# Patient Record
Sex: Female | Born: 1950
Health system: Southern US, Community
[De-identification: ages and names within clinical notes are randomized; demographics above are authoritative.]

## PROBLEM LIST (undated history)

## (undated) DIAGNOSIS — Z803 Family history of malignant neoplasm of breast: Secondary | ICD-10-CM

## (undated) DIAGNOSIS — E559 Vitamin D deficiency, unspecified: Secondary | ICD-10-CM

## (undated) DIAGNOSIS — G2581 Restless legs syndrome: Secondary | ICD-10-CM

## (undated) DIAGNOSIS — T753XXA Motion sickness, initial encounter: Secondary | ICD-10-CM

## (undated) DIAGNOSIS — E7800 Pure hypercholesterolemia, unspecified: Secondary | ICD-10-CM

## (undated) DIAGNOSIS — I10 Essential (primary) hypertension: Secondary | ICD-10-CM

## (undated) DIAGNOSIS — M810 Age-related osteoporosis without current pathological fracture: Secondary | ICD-10-CM

## (undated) DIAGNOSIS — Z801 Family history of malignant neoplasm of trachea, bronchus and lung: Secondary | ICD-10-CM

## (undated) DIAGNOSIS — Z87442 Personal history of urinary calculi: Secondary | ICD-10-CM

## (undated) DIAGNOSIS — K219 Gastro-esophageal reflux disease without esophagitis: Secondary | ICD-10-CM

## (undated) HISTORY — DX: Family history of malignant neoplasm of trachea, bronchus and lung: Z80.1

## (undated) HISTORY — PX: COLONOSCOPY: SHX174

## (undated) HISTORY — PX: ABDOMINAL HYSTERECTOMY: SHX81

## (undated) HISTORY — PX: BREAST CYST ASPIRATION: SHX578

## (undated) HISTORY — DX: Family history of malignant neoplasm of breast: Z80.3

---

## 1998-01-12 HISTORY — PX: FOOT SURGERY: SHX648

## 2002-12-11 ENCOUNTER — Other Ambulatory Visit: Payer: Self-pay

## 2004-07-24 ENCOUNTER — Ambulatory Visit: Payer: Self-pay | Admitting: Internal Medicine

## 2005-08-18 ENCOUNTER — Ambulatory Visit: Payer: Self-pay | Admitting: Internal Medicine

## 2005-08-18 ENCOUNTER — Ambulatory Visit: Payer: Self-pay

## 2006-06-30 ENCOUNTER — Ambulatory Visit: Payer: Self-pay | Admitting: Internal Medicine

## 2006-08-23 ENCOUNTER — Ambulatory Visit: Payer: Self-pay | Admitting: Internal Medicine

## 2007-08-01 ENCOUNTER — Ambulatory Visit: Payer: Self-pay | Admitting: Internal Medicine

## 2007-08-02 ENCOUNTER — Ambulatory Visit: Payer: Self-pay

## 2007-08-08 ENCOUNTER — Ambulatory Visit: Payer: Self-pay | Admitting: Internal Medicine

## 2007-08-10 ENCOUNTER — Ambulatory Visit: Payer: Self-pay | Admitting: Internal Medicine

## 2007-09-15 ENCOUNTER — Ambulatory Visit: Payer: Self-pay | Admitting: Internal Medicine

## 2007-09-27 ENCOUNTER — Ambulatory Visit: Payer: Self-pay | Admitting: Unknown Physician Specialty

## 2008-10-18 ENCOUNTER — Ambulatory Visit: Payer: Self-pay | Admitting: Family Medicine

## 2008-10-30 ENCOUNTER — Ambulatory Visit: Payer: Self-pay | Admitting: Family Medicine

## 2009-03-21 ENCOUNTER — Ambulatory Visit: Payer: Self-pay | Admitting: Urology

## 2009-08-28 ENCOUNTER — Ambulatory Visit: Payer: Self-pay | Admitting: Anesthesiology

## 2009-09-02 ENCOUNTER — Ambulatory Visit: Payer: Self-pay | Admitting: Urology

## 2009-11-20 ENCOUNTER — Ambulatory Visit: Payer: Self-pay | Admitting: Family Medicine

## 2009-11-21 ENCOUNTER — Ambulatory Visit: Payer: Self-pay | Admitting: Family Medicine

## 2009-12-04 ENCOUNTER — Ambulatory Visit: Payer: Self-pay | Admitting: Family Medicine

## 2010-04-25 ENCOUNTER — Ambulatory Visit: Payer: Self-pay | Admitting: Family Medicine

## 2010-05-14 ENCOUNTER — Ambulatory Visit: Payer: Self-pay | Admitting: Family Medicine

## 2011-02-11 ENCOUNTER — Ambulatory Visit: Payer: Self-pay | Admitting: Family Medicine

## 2011-06-24 ENCOUNTER — Ambulatory Visit: Payer: Self-pay | Admitting: Family Medicine

## 2012-02-16 ENCOUNTER — Ambulatory Visit: Payer: Self-pay | Admitting: Family Medicine

## 2013-02-16 ENCOUNTER — Ambulatory Visit: Payer: Self-pay | Admitting: Family Medicine

## 2014-04-05 ENCOUNTER — Ambulatory Visit: Payer: Self-pay | Admitting: Internal Medicine

## 2014-04-06 ENCOUNTER — Ambulatory Visit: Payer: Self-pay | Admitting: Internal Medicine

## 2015-04-01 ENCOUNTER — Other Ambulatory Visit: Payer: Self-pay | Admitting: Internal Medicine

## 2015-04-01 DIAGNOSIS — Z0000 Encounter for general adult medical examination without abnormal findings: Secondary | ICD-10-CM | POA: Diagnosis not present

## 2015-04-01 DIAGNOSIS — F5104 Psychophysiologic insomnia: Secondary | ICD-10-CM | POA: Diagnosis not present

## 2015-04-01 DIAGNOSIS — F17220 Nicotine dependence, chewing tobacco, uncomplicated: Secondary | ICD-10-CM | POA: Diagnosis not present

## 2015-04-01 DIAGNOSIS — E559 Vitamin D deficiency, unspecified: Secondary | ICD-10-CM | POA: Diagnosis not present

## 2015-04-01 DIAGNOSIS — Z1231 Encounter for screening mammogram for malignant neoplasm of breast: Secondary | ICD-10-CM

## 2015-04-01 DIAGNOSIS — Z1211 Encounter for screening for malignant neoplasm of colon: Secondary | ICD-10-CM | POA: Diagnosis not present

## 2015-04-01 DIAGNOSIS — E7800 Pure hypercholesterolemia, unspecified: Secondary | ICD-10-CM | POA: Diagnosis not present

## 2015-04-01 DIAGNOSIS — M8580 Other specified disorders of bone density and structure, unspecified site: Secondary | ICD-10-CM | POA: Diagnosis not present

## 2015-04-01 DIAGNOSIS — Z79899 Other long term (current) drug therapy: Secondary | ICD-10-CM | POA: Diagnosis not present

## 2015-04-01 DIAGNOSIS — I10 Essential (primary) hypertension: Secondary | ICD-10-CM | POA: Diagnosis not present

## 2015-04-01 DIAGNOSIS — Z1239 Encounter for other screening for malignant neoplasm of breast: Secondary | ICD-10-CM | POA: Diagnosis not present

## 2015-04-01 DIAGNOSIS — Z720 Tobacco use: Secondary | ICD-10-CM | POA: Diagnosis not present

## 2015-04-04 DIAGNOSIS — Z1211 Encounter for screening for malignant neoplasm of colon: Secondary | ICD-10-CM | POA: Diagnosis not present

## 2015-04-08 ENCOUNTER — Other Ambulatory Visit: Payer: Self-pay | Admitting: Internal Medicine

## 2015-04-08 ENCOUNTER — Ambulatory Visit
Admission: RE | Admit: 2015-04-08 | Discharge: 2015-04-08 | Disposition: A | Discharge status: Home / Self Care | Payer: PPO | Source: Ambulatory Visit | Attending: Internal Medicine | Admitting: Internal Medicine

## 2015-04-08 DIAGNOSIS — Z1231 Encounter for screening mammogram for malignant neoplasm of breast: Secondary | ICD-10-CM | POA: Diagnosis not present

## 2015-04-10 DIAGNOSIS — M8580 Other specified disorders of bone density and structure, unspecified site: Secondary | ICD-10-CM | POA: Diagnosis not present

## 2015-07-30 DIAGNOSIS — L728 Other follicular cysts of the skin and subcutaneous tissue: Secondary | ICD-10-CM | POA: Diagnosis not present

## 2016-01-28 DIAGNOSIS — H25813 Combined forms of age-related cataract, bilateral: Secondary | ICD-10-CM | POA: Diagnosis not present

## 2016-03-10 DIAGNOSIS — Z85828 Personal history of other malignant neoplasm of skin: Secondary | ICD-10-CM | POA: Diagnosis not present

## 2016-03-10 DIAGNOSIS — L920 Granuloma annulare: Secondary | ICD-10-CM | POA: Diagnosis not present

## 2016-03-25 ENCOUNTER — Other Ambulatory Visit: Payer: Self-pay | Admitting: Internal Medicine

## 2016-03-25 DIAGNOSIS — Z1231 Encounter for screening mammogram for malignant neoplasm of breast: Secondary | ICD-10-CM

## 2016-04-01 DIAGNOSIS — E7800 Pure hypercholesterolemia, unspecified: Secondary | ICD-10-CM | POA: Diagnosis not present

## 2016-04-01 DIAGNOSIS — N390 Urinary tract infection, site not specified: Secondary | ICD-10-CM | POA: Diagnosis not present

## 2016-04-01 DIAGNOSIS — Z0000 Encounter for general adult medical examination without abnormal findings: Secondary | ICD-10-CM | POA: Diagnosis not present

## 2016-04-01 DIAGNOSIS — I10 Essential (primary) hypertension: Secondary | ICD-10-CM | POA: Diagnosis not present

## 2016-04-01 DIAGNOSIS — Z720 Tobacco use: Secondary | ICD-10-CM | POA: Diagnosis not present

## 2016-04-01 DIAGNOSIS — Z1211 Encounter for screening for malignant neoplasm of colon: Secondary | ICD-10-CM | POA: Diagnosis not present

## 2016-04-01 DIAGNOSIS — E559 Vitamin D deficiency, unspecified: Secondary | ICD-10-CM | POA: Diagnosis not present

## 2016-04-01 DIAGNOSIS — Z79899 Other long term (current) drug therapy: Secondary | ICD-10-CM | POA: Diagnosis not present

## 2016-04-01 DIAGNOSIS — Z1231 Encounter for screening mammogram for malignant neoplasm of breast: Secondary | ICD-10-CM | POA: Diagnosis not present

## 2016-04-01 DIAGNOSIS — Z1329 Encounter for screening for other suspected endocrine disorder: Secondary | ICD-10-CM | POA: Diagnosis not present

## 2016-04-01 DIAGNOSIS — F5104 Psychophysiologic insomnia: Secondary | ICD-10-CM | POA: Diagnosis not present

## 2016-04-01 DIAGNOSIS — M810 Age-related osteoporosis without current pathological fracture: Secondary | ICD-10-CM | POA: Diagnosis not present

## 2016-04-08 ENCOUNTER — Encounter (INDEPENDENT_AMBULATORY_CARE_PROVIDER_SITE_OTHER): Payer: Self-pay

## 2016-04-08 ENCOUNTER — Ambulatory Visit
Admission: RE | Admit: 2016-04-08 | Discharge: 2016-04-08 | Disposition: A | Discharge status: Home / Self Care | Payer: PPO | Source: Ambulatory Visit | Attending: Internal Medicine | Admitting: Internal Medicine

## 2016-04-08 DIAGNOSIS — Z1231 Encounter for screening mammogram for malignant neoplasm of breast: Secondary | ICD-10-CM | POA: Insufficient documentation

## 2016-05-12 ENCOUNTER — Ambulatory Visit
Admission: EM | Admit: 2016-05-12 | Discharge: 2016-05-12 | Disposition: A | Discharge status: Home / Self Care | Payer: PPO | Attending: Family Medicine | Admitting: Family Medicine

## 2016-05-12 DIAGNOSIS — K115 Sialolithiasis: Secondary | ICD-10-CM

## 2016-05-12 DIAGNOSIS — R220 Localized swelling, mass and lump, head: Secondary | ICD-10-CM

## 2016-05-12 HISTORY — DX: Essential (primary) hypertension: I10

## 2016-05-12 HISTORY — DX: Pure hypercholesterolemia, unspecified: E78.00

## 2016-05-12 NOTE — ED Provider Notes (Signed)
CSN: [id number]     Arrival date & time 05/12/16  1000 History   First MD Initiated Contact with Patient 05/12/16 1031     Chief Complaint  Patient presents with  . Facial Swelling   (Consider location/radiation/quality/duration/timing/severity/associated sxs/prior Treatment) HPI  This is a 66 year old smoker who presents with right cheek swelling she's had for month. She states that he doesn't know if it had decreased at all in size but recently has increased again. It is painless. He states that she seems to have more saliva in her mouth than less. She has not noticed any hard masses. Has had no throat problems. Does smoke 1 pack of cigarettes per day. She denies any facial drooping, numbness or tingling,Tooth pain, decreased taste, tongue pain.       Past Medical History:  Diagnosis Date  . High cholesterol   . Hypertension    Past Surgical History:  Procedure Laterality Date  . ABDOMINAL HYSTERECTOMY    . BREAST CYST ASPIRATION Left    Family History  Problem Relation Age of Onset  . Breast cancer Maternal Aunt   . Breast cancer Paternal Grandmother   . Breast cancer Cousin 40    paternal side   Social History  Substance Use Topics  . Smoking status: Current Every Day Smoker    Packs/day: 1.00    Types: Cigarettes  . Smokeless tobacco: Never Used  . Alcohol use No   OB History    No data available     Review of Systems  Constitutional: Negative for activity change, appetite change, chills, fatigue and fever.  HENT: Positive for facial swelling. Negative for dental problem, drooling and mouth sores.   All other systems reviewed and are negative.   Allergies  Patient has no known allergies.  Home Medications   Prior to Admission medications   Medication Sig Start Date End Date Taking? Authorizing Provider  alendronate (FOSAMAX) 10 MG tablet Take 10 mg by mouth once a week. Take with a full glass of water on an empty stomach.   Yes Historical Provider, MD   alendronate (FOSAMAX) 70 MG tablet Take 70 mg by mouth once a week. Take with a full glass of water on an empty stomach.   Yes Historical Provider, MD  aspirin 81 MG chewable tablet Chew by mouth daily.   Yes Historical Provider, MD  atorvastatin (LIPITOR) 20 MG tablet Take 20 mg by mouth daily.   Yes Historical Provider, MD  bisoprolol-hydrochlorothiazide (ZIAC) 10-6.25 MG tablet Take 1 tablet by mouth daily.   Yes Historical Provider, MD  bisoprolol-hydrochlorothiazide (ZIAC) 2.5-6.25 MG tablet Take 1 tablet by mouth daily.   Yes Historical Provider, MD  traZODone (DESYREL) 100 MG tablet Take 100 mg by mouth at bedtime.   Yes Historical Provider, MD   Meds Ordered and Administered this Visit  Medications - No data to display  BP 137/86 (BP Location: Left Arm)   Pulse (!) 56   Temp 97.6 F (36.4 C)   Resp 18   Ht 5\' 6"  (1.676 m)   Wt 135 lb (61.2 kg)   SpO2 95%   BMI 21.79 kg/m  No data found.   Physical Exam  Constitutional: She is oriented to person, place, and time. She appears well-developed and well-nourished. No distress.  HENT:  Head: Normocephalic.  Semination the patient's face shows some mild swelling over the right cheek just inferior to the malar region extending to the second molar. There is no hard mass present. It  is soft but a boggy consistency. She has no lesions on the inner cheek has no tongue lesions noticeable no palpable abnormalities in the base of her tongue or mouth. There is no cervical adenopathy present. It is nontender. Not appear to have any abscessed teeth.  Eyes: Pupils are equal, round, and reactive to light. Right eye exhibits no discharge. Left eye exhibits no discharge.  Neck: Normal range of motion. Neck supple.  Musculoskeletal: Normal range of motion.  Lymphadenopathy:    She has no cervical adenopathy.  Neurological: She is alert and oriented to person, place, and time.  Skin: Skin is warm and dry. She is not diaphoretic.  Psychiatric: She  has a normal mood and affect. Her behavior is normal. Judgment and thought content normal.  Nursing note and vitals reviewed.   Urgent Care Course     Procedures (including critical care time)  Labs Review Labs Reviewed - No data to display  Imaging Review No results found.   Visual Acuity Review  Right Eye Distance:   Left Eye Distance:   Bilateral Distance:    Right Eye Near:   Left Eye Near:    Bilateral Near:         MDM   1. Facial swelling   2. Sialolithiasis    Plan: 1. Test/x-ray results and diagnosis reviewed with patient 2. rx as per orders; risks, benefits, potential side effects reviewed with patient 3. Recommend supportive treatment with Sucking on hard sour candies daily. I suspect there may be a problem with Stensen's duct from the parotid gland but have recommended that the patient be examined by an ear nose and throat specialist for definitive diagnosis and care. She is given the name of Dr. Ladene Artist she will make an arrangement today for an evaluation. She states she has a cruise planned for Saturday and would prefer to have it before then. 4. F/u prn if symptoms worsen or don't improve     Lorin Picket, PA-C 05/12/16 1807

## 2016-05-12 NOTE — ED Triage Notes (Signed)
Pt reports this happened a few months ago and went away but has returned. Right sided facial swelling. No pain. No trouble swallowing, no dental issues.

## 2016-05-13 DIAGNOSIS — R220 Localized swelling, mass and lump, head: Secondary | ICD-10-CM | POA: Diagnosis not present

## 2016-08-28 DIAGNOSIS — L920 Granuloma annulare: Secondary | ICD-10-CM | POA: Diagnosis not present

## 2016-09-13 ENCOUNTER — Encounter: Payer: Self-pay | Admitting: *Deleted

## 2016-09-13 ENCOUNTER — Ambulatory Visit
Admission: EM | Admit: 2016-09-13 | Discharge: 2016-09-13 | Disposition: A | Discharge status: Home / Self Care | Payer: PPO | Attending: Family Medicine | Admitting: Family Medicine

## 2016-09-13 ENCOUNTER — Telehealth: Payer: Self-pay

## 2016-09-13 DIAGNOSIS — S39012A Strain of muscle, fascia and tendon of lower back, initial encounter: Secondary | ICD-10-CM | POA: Diagnosis not present

## 2016-09-13 DIAGNOSIS — N390 Urinary tract infection, site not specified: Secondary | ICD-10-CM | POA: Diagnosis not present

## 2016-09-13 LAB — URINALYSIS, COMPLETE (UACMP) WITH MICROSCOPIC
Bilirubin Urine: NEGATIVE
Glucose, UA: NEGATIVE mg/dL
KETONES UR: NEGATIVE mg/dL
Nitrite: NEGATIVE
PROTEIN: NEGATIVE mg/dL
Specific Gravity, Urine: 1.025 (ref 1.005–1.030)
pH: 6 (ref 5.0–8.0)

## 2016-09-13 MED ORDER — NAPROXEN 375 MG PO TABS: 375.0000 mg | ORAL_TABLET | Freq: Two times a day (BID) | ORAL | 0 refills | Status: DC

## 2016-09-13 MED ORDER — KETOROLAC TROMETHAMINE 60 MG/2ML IM SOLN
30.0000 mg | Freq: Once | INTRAMUSCULAR | Status: AC
  Administered 2016-09-13: 30 mg via INTRAMUSCULAR

## 2016-09-13 MED ORDER — METHOCARBAMOL 500 MG PO TABS: 500.0000 mg | ORAL_TABLET | Freq: Four times a day (QID) | ORAL | 0 refills | Status: DC

## 2016-09-13 MED ORDER — CEPHALEXIN 500 MG PO CAPS: 500.0000 mg | ORAL_CAPSULE | Freq: Two times a day (BID) | ORAL | 0 refills | Status: DC

## 2016-09-13 NOTE — ED Provider Notes (Signed)
[facility]    CSN: [id number] Arrival date & time: 09/13/16  1449     History   Chief Complaint Chief Complaint  Patient presents with  . Back Pain  . Dysuria    HPI Kim Cook is a 66 y.o. female.   HPI  This 66 year old female who presents with 2 separate problems.  First problem is low back pain on the right side that radiates into her sciatic notch and down the posterior leg to level of her knee. He states it is much worse in the morning and then as the day progresses he was up somewhat but is always present. She states that the week before the onset of her symptoms she was busy a Psychologist, prison and probation services. Shortly afterwards she began to have the onset of these symptoms. He states that it does hurt to ambulate and she does limp at first and then improves with that distance.  Second problem is that of dysuria frequency urgency. Patient said no vaginal discharge. He says she's had numerous kidney stones in the past Been examined on one time. Normally she is able to pass them spontaneously on her own. So had UTIs in the past. And this does feel similar to that. She's had no flank pain; denies any nausea vomiting or fever.        Past Medical History:  Diagnosis Date  . High cholesterol   . Hypertension     There are no active problems to display for this patient.   Past Surgical History:  Procedure Laterality Date  . ABDOMINAL HYSTERECTOMY    . BREAST CYST ASPIRATION Left     OB History    No data available       Home Medications    Prior to Admission medications   Medication Sig Start Date End Date Taking? Authorizing Provider  alendronate (FOSAMAX) 10 MG tablet Take 10 mg by mouth once a week. Take with a full glass of water on an empty stomach.   Yes [provider]  alendronate (FOSAMAX) 70 MG tablet Take 70 mg by mouth once a week. Take with a full glass of water on an empty stomach.   Yes [provider]  aspirin 81 MG chewable tablet Chew by mouth daily.   Yes [provider]  atorvastatin (LIPITOR) 20 MG tablet Take 20 mg by mouth daily.   Yes [provider]  bisoprolol-hydrochlorothiazide (ZIAC) 10-6.25 MG tablet Take 1 tablet by mouth daily.   Yes [provider]  bisoprolol-hydrochlorothiazide (ZIAC) 2.5-6.25 MG tablet Take 1 tablet by mouth daily.   Yes [provider]  traZODone (DESYREL) 100 MG tablet Take 100 mg by mouth at bedtime.   Yes [provider]  cephALEXin (KEFLEX) 500 MG capsule Take 1 capsule (500 mg total) by mouth 2 (two) times daily. 09/13/16   Lorin Picket, PA-C  methocarbamol (ROBAXIN) 500 MG tablet Take 1 tablet (500 mg total) by mouth 4 (four) times daily. 09/13/16   Lorin Picket, PA-C  naproxen (NAPROSYN) 375 MG tablet Take 1 tablet (375 mg total) by mouth 2 (two) times daily with a meal. 09/13/16   Lorin Picket, PA-C    Family History Family History  Problem Relation Age of Onset  . Breast cancer Maternal Aunt   . Breast cancer Paternal Grandmother   . Breast cancer Cousin 40       paternal side    Social History Social History  Substance Use Topics  . Smoking status: Current Every Day Smoker    Packs/day: 1.00    Types: Cigarettes  . Smokeless tobacco: Never Used  . Alcohol use No     Allergies   Patient has no known allergies.   Review of Systems Review of Systems  Constitutional: Positive for activity change. Negative for chills, fatigue and fever.  Genitourinary: Positive for dysuria, frequency and urgency. Negative for vaginal discharge and vaginal pain.  Musculoskeletal: Positive for back pain, gait problem and myalgias.  All other systems reviewed and are negative.    Physical Exam Triage Vital Signs ED Triage Vitals  Enc Vitals Group     BP 09/13/16 1508 (!) 125/59     Pulse Rate 09/13/16 1508 60     Resp 09/13/16 1508 16     Temp 09/13/16 1508 98.3 F (36.8  C)     Temp Source 09/13/16 1508 Oral     SpO2 09/13/16 1508 98 %     Weight 09/13/16 1510 125 lb (56.7 kg)     Height 09/13/16 1510 5\' 5"  (1.651 m)     Head Circumference --      Peak Flow --      Pain Score 09/13/16 1511 6     Pain Loc --      Pain Edu? --      Excl. in [place]? --    No data found.   Updated Vital Signs BP (!) 125/59 (BP Location: Left Arm)   Pulse 60   Temp 98.3 F (36.8 C) (Oral)   Resp 16   Ht 5\' 5"  (1.651 m)   Wt 125 lb (56.7 kg)   SpO2 98%   BMI 20.80 kg/m   Visual Acuity Right Eye Distance:   Left Eye Distance:   Bilateral Distance:    Right Eye Near:   Left Eye Near:    Bilateral Near:     Physical Exam  Constitutional: She appears well-developed and well-nourished. No distress.  HENT:  Head: Normocephalic.  Eyes: Pupils are equal, round, and reactive to light.  Neck: Normal range of motion.  Abdominal: Soft. Bowel sounds are normal.  Musculoskeletal:  Examination of the lumbar spine was performed with Rosendo Gros, CM a chaperone and assistant. Patient's pelvis is level in stance. She has obvious scoliosis present. Is able to forward flex with her hands to level of her knees. Maintaining upright posture is painful in her lumbar spine. Left lateral flexion causes pain in the right lower back and  right sacroiliac joint. Ambulation is able to toe and walk heel walk adequately. Lower extremity DTRs are 2+ over 4 and symmetrical. Sensation is intact to light touch. EHL peroneal and anterior tibialis muscle are strong. Her leg raise testing on the left in the seated position is negative. Is positive on the right at approximately 40 with low back pain with radiation into her posterior thigh. With the patient recumbent palpation of the righ lumbar spine paraspinous muscles and sacroiliac joint are very tender and causing her discomfort she's been experiencing  Skin: She is not diaphoretic.  Nursing note and vitals reviewed.    UC Treatments / Results    Labs (all labs ordered are listed, but only abnormal results are displayed) Labs Reviewed  URINALYSIS, COMPLETE (UACMP) WITH MICROSCOPIC - Abnormal; Notable for the following:       Result Value   APPearance HAZY (*)    Hgb urine dipstick TRACE (*)    Leukocytes, UA SMALL (*)  Squamous Epithelial / LPF 6-30 (*)    Bacteria, UA FEW (*)    All other components within normal limits  URINE CULTURE    EKG  EKG Interpretation None       Radiology No results found.  Procedures Procedures (including critical care time)  Medications Ordered in UC Medications  ketorolac (TORADOL) injection 30 mg (30 mg Intramuscular Given 09/13/16 1616)     Initial Impression / Assessment and Plan / UC Course  I have reviewed the triage vital signs and the nursing notes.  Pertinent labs & imaging results that were available during my care of the patient were reviewed by me and considered in my medical decision making (see chart for details).     Plan: 1. Test/x-ray results and diagnosis reviewed with patient 2. rx as per orders; risks, benefits, potential side effects reviewed with patient 3. Recommend supportive treatment with ice to the lumbar spine 20 minutes out of every 2 hours a 4-5 times per day. Advised her she should avoid symptoms as much as feasible. She should avoid sitting lifting and bending. I've told her that she should be active still with walking and recumbent to help control her pain. For her urine culture  this may be a mixed flora due to the increase to squamous epithelial cells. I will treat her empirically based on the white cell count, trace amount of leukocyte Estrace that she demonstrates as well as her symptoms. Cultures will be available in 48 hours. 4. F/u prn if symptoms worsen or don't improve   Final Clinical Impressions(s) / UC Diagnoses   Final diagnoses:  Lower urinary tract infectious disease  Strain of lumbar region, initial encounter    New  Prescriptions Discharge Medication List as of 09/13/2016  4:18 PM    START taking these medications   Details  cephALEXin (KEFLEX) 500 MG capsule Take 1 capsule (500 mg total) by mouth 2 (two) times daily., Starting Sun 09/13/2016, Normal    methocarbamol (ROBAXIN) 500 MG tablet Take 1 tablet (500 mg total) by mouth 4 (four) times daily., Starting Sun 09/13/2016, Normal    naproxen (NAPROSYN) 375 MG tablet Take 1 tablet (375 mg total) by mouth 2 (two) times daily with a meal., Starting Sun 09/13/2016, Normal         Controlled Substance Prescriptions [place] Controlled Substance Registry consulted? Not Applicable   Lorin Picket, PA-C 09/13/16 1657

## 2016-09-13 NOTE — ED Triage Notes (Signed)
Low back pain that radiates to her right leg x2 weeks. Denies injury. Also, dysuria and hx of kidney stones and UTIs.

## 2016-09-15 LAB — URINE CULTURE: Culture: NO GROWTH

## 2016-10-28 DIAGNOSIS — D485 Neoplasm of uncertain behavior of skin: Secondary | ICD-10-CM | POA: Diagnosis not present

## 2016-10-28 DIAGNOSIS — L920 Granuloma annulare: Secondary | ICD-10-CM | POA: Diagnosis not present

## 2017-02-23 ENCOUNTER — Other Ambulatory Visit: Payer: Self-pay | Admitting: Internal Medicine

## 2017-02-23 DIAGNOSIS — Z1231 Encounter for screening mammogram for malignant neoplasm of breast: Secondary | ICD-10-CM

## 2017-04-02 DIAGNOSIS — M810 Age-related osteoporosis without current pathological fracture: Secondary | ICD-10-CM | POA: Diagnosis not present

## 2017-04-02 DIAGNOSIS — Z720 Tobacco use: Secondary | ICD-10-CM | POA: Diagnosis not present

## 2017-04-02 DIAGNOSIS — Z1231 Encounter for screening mammogram for malignant neoplasm of breast: Secondary | ICD-10-CM | POA: Diagnosis not present

## 2017-04-02 DIAGNOSIS — M8580 Other specified disorders of bone density and structure, unspecified site: Secondary | ICD-10-CM | POA: Diagnosis not present

## 2017-04-02 DIAGNOSIS — E559 Vitamin D deficiency, unspecified: Secondary | ICD-10-CM | POA: Diagnosis not present

## 2017-04-02 DIAGNOSIS — Z79899 Other long term (current) drug therapy: Secondary | ICD-10-CM | POA: Diagnosis not present

## 2017-04-02 DIAGNOSIS — Z1211 Encounter for screening for malignant neoplasm of colon: Secondary | ICD-10-CM | POA: Diagnosis not present

## 2017-04-02 DIAGNOSIS — I10 Essential (primary) hypertension: Secondary | ICD-10-CM | POA: Diagnosis not present

## 2017-04-02 DIAGNOSIS — Z0000 Encounter for general adult medical examination without abnormal findings: Secondary | ICD-10-CM | POA: Diagnosis not present

## 2017-04-02 DIAGNOSIS — Z13820 Encounter for screening for osteoporosis: Secondary | ICD-10-CM | POA: Diagnosis not present

## 2017-04-02 DIAGNOSIS — F5104 Psychophysiologic insomnia: Secondary | ICD-10-CM | POA: Diagnosis not present

## 2017-04-02 DIAGNOSIS — E7800 Pure hypercholesterolemia, unspecified: Secondary | ICD-10-CM | POA: Diagnosis not present

## 2017-04-13 ENCOUNTER — Ambulatory Visit
Admission: RE | Admit: 2017-04-13 | Discharge: 2017-04-13 | Disposition: A | Discharge status: Home / Self Care | Payer: PPO | Source: Ambulatory Visit | Attending: Internal Medicine | Admitting: Internal Medicine

## 2017-04-13 DIAGNOSIS — Z1231 Encounter for screening mammogram for malignant neoplasm of breast: Secondary | ICD-10-CM

## 2017-04-15 DIAGNOSIS — Z13820 Encounter for screening for osteoporosis: Secondary | ICD-10-CM | POA: Diagnosis not present

## 2017-04-15 DIAGNOSIS — M810 Age-related osteoporosis without current pathological fracture: Secondary | ICD-10-CM | POA: Diagnosis not present

## 2017-04-15 DIAGNOSIS — M8588 Other specified disorders of bone density and structure, other site: Secondary | ICD-10-CM | POA: Diagnosis not present

## 2017-06-08 DIAGNOSIS — Z1211 Encounter for screening for malignant neoplasm of colon: Secondary | ICD-10-CM | POA: Diagnosis not present

## 2017-08-07 DIAGNOSIS — H524 Presbyopia: Secondary | ICD-10-CM | POA: Diagnosis not present

## 2017-09-11 ENCOUNTER — Ambulatory Visit
Admission: EM | Admit: 2017-09-11 | Discharge: 2017-09-11 | Disposition: A | Discharge status: Home / Self Care | Payer: PPO | Attending: Family Medicine | Admitting: Family Medicine

## 2017-09-11 ENCOUNTER — Ambulatory Visit (INDEPENDENT_AMBULATORY_CARE_PROVIDER_SITE_OTHER): Payer: PPO

## 2017-09-11 DIAGNOSIS — R0782 Intercostal pain: Secondary | ICD-10-CM | POA: Diagnosis not present

## 2017-09-11 DIAGNOSIS — R0789 Other chest pain: Secondary | ICD-10-CM | POA: Diagnosis not present

## 2017-09-11 DIAGNOSIS — S299XXA Unspecified injury of thorax, initial encounter: Secondary | ICD-10-CM | POA: Diagnosis not present

## 2017-09-11 DIAGNOSIS — S2232XA Fracture of one rib, left side, initial encounter for closed fracture: Secondary | ICD-10-CM

## 2017-09-11 MED ORDER — HYDROCODONE-ACETAMINOPHEN 5-325 MG PO TABS: ORAL_TABLET | ORAL | 0 refills | Status: DC

## 2017-09-11 NOTE — ED Provider Notes (Signed)
[facility]    CSN: [id number] Arrival date & time: 09/11/17  1318     History   Chief Complaint Chief Complaint  Patient presents with  . Rib Injury    HPI Kim Cook is a 67 y.o. female.   67 yo female with a h/o osteoporosis now with a c/o left lower ribs pain for 5 days. States pain began after she twisted her body while she was sitting watching a game. She denies any direct trauma or fall. Denies any shortness of breath, but states pain is worse if she takes a deep breath.   The history is provided by the patient.    Past Medical History:  Diagnosis Date  . High cholesterol   . Hypertension     There are no active problems to display for this patient.   Past Surgical History:  Procedure Laterality Date  . ABDOMINAL HYSTERECTOMY    . BREAST CYST ASPIRATION Left    neg    OB History   None      Home Medications    Prior to Admission medications   Medication Sig Start Date End Date Taking? Authorizing Provider  alendronate (FOSAMAX) 10 MG tablet Take 10 mg by mouth once a week. Take with a full glass of water on an empty stomach.   Yes [provider]  alendronate (FOSAMAX) 70 MG tablet Take 70 mg by mouth once a week. Take with a full glass of water on an empty stomach.   Yes [provider]  aspirin 81 MG chewable tablet Chew by mouth daily.   Yes [provider]  atorvastatin (LIPITOR) 20 MG tablet Take 20 mg by mouth daily.   Yes [provider]  bisoprolol-hydrochlorothiazide (ZIAC) 10-6.25 MG tablet Take 1 tablet by mouth daily.   Yes [provider]  bisoprolol-hydrochlorothiazide (ZIAC) 2.5-6.25 MG tablet Take 1 tablet by mouth daily.   Yes [provider]  traZODone (DESYREL) 100 MG tablet Take 100 mg by mouth at bedtime.   Yes [provider]  cephALEXin (KEFLEX) 500 MG capsule Take 1 capsule (500 mg total) by mouth 2 (two) times daily. 09/13/16   Lorin Picket,  PA-C  HYDROcodone-acetaminophen (NORCO/VICODIN) 5-325 MG tablet 1-2 tabs po qd prn 09/11/17   Norval Gable, MD  methocarbamol (ROBAXIN) 500 MG tablet Take 1 tablet (500 mg total) by mouth 4 (four) times daily. 09/13/16   Lorin Picket, PA-C  naproxen (NAPROSYN) 375 MG tablet Take 1 tablet (375 mg total) by mouth 2 (two) times daily with a meal. 09/13/16   Lorin Picket, PA-C    Family History Family History  Problem Relation Age of Onset  . Breast cancer Maternal Aunt   . Breast cancer Paternal Grandmother   . Breast cancer Cousin 40       paternal side    Social History Social History   Tobacco Use  . Smoking status: Current Every Day Smoker    Packs/day: 1.00    Types: Cigarettes  . Smokeless tobacco: Never Used  Substance Use Topics  . Alcohol use: No  . Drug use: No     Allergies   Patient has no known allergies.   Review of Systems Review of Systems   Physical Exam Triage Vital Signs ED Triage Vitals  Enc Vitals Group     BP 09/11/17 1328 109/68     Pulse Rate 09/11/17 1328 69     Resp 09/11/17 1328 18  Temp 09/11/17 1328 98.2 F (36.8 C)     Temp Source 09/11/17 1328 Oral     SpO2 09/11/17 1328 96 %     Weight 09/11/17 1330 135 lb (61.2 kg)     Height --      Head Circumference --      Peak Flow --      Pain Score 09/11/17 1330 6     Pain Loc --      Pain Edu? --      Excl. in [place]? --    No data found.  Updated Vital Signs BP 109/68 (BP Location: Left Arm)   Pulse 69   Temp 98.2 F (36.8 C) (Oral)   Resp 18   Wt 61.2 kg   SpO2 96%   BMI 22.47 kg/m   Visual Acuity Right Eye Distance:   Left Eye Distance:   Bilateral Distance:    Right Eye Near:   Left Eye Near:    Bilateral Near:     Physical Exam  Constitutional: She appears well-developed and well-nourished. No distress.  Cardiovascular: Normal rate, regular rhythm and normal heart sounds.  Pulmonary/Chest: Effort normal and breath sounds normal. No stridor. No  respiratory distress. She has no wheezes. She has no rales. She exhibits tenderness (left lower ribs area).  Skin: She is not diaphoretic.  Vitals reviewed.    UC Treatments / Results  Labs (all labs ordered are listed, but only abnormal results are displayed) Labs Reviewed - No data to display  EKG None  Radiology Dg Ribs Unilateral W/chest Left  Result Date: 09/11/2017 CLINICAL DATA:  Fall, left chest pain EXAM: LEFT RIBS AND CHEST - 3+ VIEW COMPARISON:  08/02/2007 FINDINGS: COPD with pulmonary hyperinflation. Apical scarring bilaterally. Heart size normal. Lungs are clear without infiltrate effusion. No pneumothorax. Fracture of the left eighth rib anteriorly with minimal displacement. No other rib fractures identified. Moderate to severe levoscoliosis lumbar spine. IMPRESSION: COPD.  No acute cardiopulmonary abnormality Fracture left anterior eighth rib Electronically Signed   By: Franchot Gallo M.D.   On: 09/11/2017 14:17    Procedures Procedures (including critical care time)  Medications Ordered in UC Medications - No data to display  Initial Impression / Assessment and Plan / UC Course  I have reviewed the triage vital signs and the nursing notes.  Pertinent labs & imaging results that were available during my care of the patient were reviewed by me and considered in my medical decision making (see chart for details).      Final Clinical Impressions(s) / UC Diagnoses   Final diagnoses:  Closed fracture of one rib of left side, initial encounter    ED Prescriptions    Medication Sig Dispense Auth. Provider   HYDROcodone-acetaminophen (NORCO/VICODIN) 5-325 MG tablet 1-2 tabs po qd prn 6 tablet Akasha Melena, Linward Foster, MD      1. x-ray results and diagnosis reviewed with patient 2. rx as per orders above; reviewed possible side effects, interactions, risks and benefits  3. Recommend supportive treatment with rest, ice, otc analgesics prn 4. Follow-up prn if symptoms  worsen or don't improve   Controlled Substance Prescriptions [place] Controlled Substance Registry consulted? Not Applicable   Norval Gable, MD 09/11/17 [id number]

## 2017-09-11 NOTE — ED Triage Notes (Signed)
Pt states on Monday she felt something popped in her left side near her rib when was turning around and states the pain has gotten worse. Deep breathing is causing her pain and it is keeping her up at night. Has been taking ibuprofen and tylenol without relief.

## 2017-09-15 ENCOUNTER — Encounter: Admission: RE | Payer: Self-pay | Source: Ambulatory Visit

## 2017-09-15 ENCOUNTER — Ambulatory Visit: Admission: RE | Admit: 2017-09-15 | Payer: PPO | Source: Ambulatory Visit | Admitting: Unknown Physician Specialty

## 2017-09-15 SURGERY — COLONOSCOPY WITH PROPOFOL
Anesthesia: General

## 2017-10-26 ENCOUNTER — Encounter: Payer: Self-pay | Admitting: *Deleted

## 2017-10-26 ENCOUNTER — Telehealth: Payer: Self-pay | Admitting: *Deleted

## 2017-10-26 DIAGNOSIS — Z122 Encounter for screening for malignant neoplasm of respiratory organs: Secondary | ICD-10-CM

## 2017-10-26 NOTE — Telephone Encounter (Signed)
Received a referral for initial lung cancer screening scan.  Contacted the patient and obtained their smoking history, current smoker 0.5ppd currently last 2 months with 51pkyr history   as well as answering questions related to screening process.  Patient denies signs of lung cancer such as weight loss or hemoptysis at this time.  Patient denies comorbidity that would prevent curative treatment if lung cancer were found.  Patient is scheduled for the Shared Decision Making Visit and CT scan on 11-25-17@1315 .

## 2017-11-25 ENCOUNTER — Inpatient Hospital Stay: Payer: PPO | Attending: Nurse Practitioner | Admitting: Oncology

## 2017-11-25 ENCOUNTER — Ambulatory Visit
Admission: RE | Admit: 2017-11-25 | Discharge: 2017-11-25 | Disposition: A | Discharge status: Home / Self Care | Payer: PPO | Source: Ambulatory Visit | Attending: Oncology | Admitting: Oncology

## 2017-11-25 DIAGNOSIS — I700 Atherosclerosis of aorta: Secondary | ICD-10-CM | POA: Diagnosis not present

## 2017-11-25 DIAGNOSIS — K449 Diaphragmatic hernia without obstruction or gangrene: Secondary | ICD-10-CM | POA: Diagnosis not present

## 2017-11-25 DIAGNOSIS — J438 Other emphysema: Secondary | ICD-10-CM | POA: Insufficient documentation

## 2017-11-25 DIAGNOSIS — Z122 Encounter for screening for malignant neoplasm of respiratory organs: Secondary | ICD-10-CM | POA: Diagnosis not present

## 2017-11-25 DIAGNOSIS — J432 Centrilobular emphysema: Secondary | ICD-10-CM | POA: Insufficient documentation

## 2017-11-25 DIAGNOSIS — Z87891 Personal history of nicotine dependence: Secondary | ICD-10-CM | POA: Diagnosis not present

## 2017-11-25 DIAGNOSIS — I2510 Atherosclerotic heart disease of native coronary artery without angina pectoris: Secondary | ICD-10-CM | POA: Insufficient documentation

## 2017-11-25 DIAGNOSIS — F17210 Nicotine dependence, cigarettes, uncomplicated: Secondary | ICD-10-CM | POA: Insufficient documentation

## 2017-11-25 NOTE — Progress Notes (Signed)
In accordance with CMS guidelines, patient has met eligibility criteria including age, absence of signs or symptoms of lung cancer.  Social History   Tobacco Use  . Smoking status: Current Every Day Smoker    Packs/day: 1.00    Years: 51.00    Pack years: 51.00    Types: Cigarettes  . Smokeless tobacco: Never Used  Substance Use Topics  . Alcohol use: No  . Drug use: No     A shared decision-making session was conducted prior to the performance of CT scan. This includes one or more decision aids, includes benefits and harms of screening, follow-up diagnostic testing, over-diagnosis, false positive rate, and total radiation exposure.  Counseling on the importance of adherence to annual lung cancer LDCT screening, impact of co-morbidities, and ability or willingness to undergo diagnosis and treatment is imperative for compliance of the program.  Counseling on the importance of continued smoking cessation for former smokers; the importance of smoking cessation for current smokers, and information about tobacco cessation interventions have been given to patient including [place] and 1800 quit [place] programs.  Written order for lung cancer screening with LDCT has been given to the patient and any and all questions have been answered to the best of my abilities.   Yearly follow up will be coordinated by Burgess Estelle, Thoracic Navigator.  Faythe Casa, NP 11/25/2017 2:48 PM

## 2017-11-26 ENCOUNTER — Encounter: Payer: Self-pay | Admitting: *Deleted

## 2017-12-03 ENCOUNTER — Encounter: Payer: Self-pay | Admitting: *Deleted

## 2017-12-06 ENCOUNTER — Encounter
Admission: RE | Disposition: A | Discharge status: Home / Self Care | Payer: Self-pay | Source: Ambulatory Visit | Attending: Unknown Physician Specialty

## 2017-12-06 ENCOUNTER — Ambulatory Visit: Payer: PPO | Admitting: Anesthesiology

## 2017-12-06 ENCOUNTER — Encounter: Payer: Self-pay | Admitting: Anesthesiology

## 2017-12-06 ENCOUNTER — Ambulatory Visit
Admission: RE | Admit: 2017-12-06 | Discharge: 2017-12-06 | Disposition: A | Discharge status: Home / Self Care | Payer: PPO | Source: Ambulatory Visit | Attending: Unknown Physician Specialty | Admitting: Unknown Physician Specialty

## 2017-12-06 DIAGNOSIS — Z7983 Long term (current) use of bisphosphonates: Secondary | ICD-10-CM | POA: Diagnosis not present

## 2017-12-06 DIAGNOSIS — Z791 Long term (current) use of non-steroidal anti-inflammatories (NSAID): Secondary | ICD-10-CM | POA: Insufficient documentation

## 2017-12-06 DIAGNOSIS — E7800 Pure hypercholesterolemia, unspecified: Secondary | ICD-10-CM | POA: Diagnosis not present

## 2017-12-06 DIAGNOSIS — Z1211 Encounter for screening for malignant neoplasm of colon: Secondary | ICD-10-CM | POA: Diagnosis not present

## 2017-12-06 DIAGNOSIS — Z79899 Other long term (current) drug therapy: Secondary | ICD-10-CM | POA: Diagnosis not present

## 2017-12-06 DIAGNOSIS — Z79891 Long term (current) use of opiate analgesic: Secondary | ICD-10-CM | POA: Diagnosis not present

## 2017-12-06 DIAGNOSIS — I10 Essential (primary) hypertension: Secondary | ICD-10-CM | POA: Insufficient documentation

## 2017-12-06 DIAGNOSIS — K648 Other hemorrhoids: Secondary | ICD-10-CM | POA: Diagnosis not present

## 2017-12-06 DIAGNOSIS — F17210 Nicotine dependence, cigarettes, uncomplicated: Secondary | ICD-10-CM | POA: Insufficient documentation

## 2017-12-06 DIAGNOSIS — K5730 Diverticulosis of large intestine without perforation or abscess without bleeding: Secondary | ICD-10-CM | POA: Diagnosis not present

## 2017-12-06 DIAGNOSIS — K219 Gastro-esophageal reflux disease without esophagitis: Secondary | ICD-10-CM | POA: Diagnosis not present

## 2017-12-06 DIAGNOSIS — M810 Age-related osteoporosis without current pathological fracture: Secondary | ICD-10-CM | POA: Diagnosis not present

## 2017-12-06 DIAGNOSIS — E559 Vitamin D deficiency, unspecified: Secondary | ICD-10-CM | POA: Diagnosis not present

## 2017-12-06 DIAGNOSIS — Z7982 Long term (current) use of aspirin: Secondary | ICD-10-CM | POA: Diagnosis not present

## 2017-12-06 DIAGNOSIS — D126 Benign neoplasm of colon, unspecified: Secondary | ICD-10-CM | POA: Diagnosis not present

## 2017-12-06 DIAGNOSIS — K635 Polyp of colon: Secondary | ICD-10-CM | POA: Diagnosis not present

## 2017-12-06 DIAGNOSIS — K5790 Diverticulosis of intestine, part unspecified, without perforation or abscess without bleeding: Secondary | ICD-10-CM | POA: Diagnosis not present

## 2017-12-06 HISTORY — DX: Gastro-esophageal reflux disease without esophagitis: K21.9

## 2017-12-06 HISTORY — DX: Vitamin D deficiency, unspecified: E55.9

## 2017-12-06 HISTORY — PX: COLONOSCOPY WITH PROPOFOL: SHX5780

## 2017-12-06 HISTORY — DX: Restless legs syndrome: G25.81

## 2017-12-06 HISTORY — DX: Personal history of urinary calculi: Z87.442

## 2017-12-06 HISTORY — DX: Age-related osteoporosis without current pathological fracture: M81.0

## 2017-12-06 SURGERY — COLONOSCOPY WITH PROPOFOL
Anesthesia: General

## 2017-12-06 MED ORDER — ARMC OPHTHALMIC DILATING DROPS
OPHTHALMIC | Status: AC
  Filled 2017-12-06: qty 0.5

## 2017-12-06 MED ORDER — PROPOFOL 500 MG/50ML IV EMUL
INTRAVENOUS | Status: DC | PRN
  Administered 2017-12-06: 150 ug/kg/min via INTRAVENOUS

## 2017-12-06 MED ORDER — PROPOFOL 500 MG/50ML IV EMUL
INTRAVENOUS | Status: AC
  Filled 2017-12-06: qty 50

## 2017-12-06 MED ORDER — SODIUM CHLORIDE 0.9 % IV SOLN
INTRAVENOUS | Status: DC
  Administered 2017-12-06: 16:00:00 via INTRAVENOUS

## 2017-12-06 MED ORDER — PROPOFOL 10 MG/ML IV BOLUS
INTRAVENOUS | Status: DC | PRN
  Administered 2017-12-06: 50 mg via INTRAVENOUS

## 2017-12-06 MED ORDER — LIDOCAINE HCL (CARDIAC) PF 100 MG/5ML IV SOSY
PREFILLED_SYRINGE | INTRAVENOUS | Status: DC | PRN
  Administered 2017-12-06: 50 mg via INTRAVENOUS

## 2017-12-06 NOTE — Anesthesia Preprocedure Evaluation (Addendum)
Anesthesia Evaluation  Patient identified by MRN, date of birth, ID band Patient awake    Reviewed: Allergy & Precautions, NPO status , Patient's Chart, lab work & pertinent test results, reviewed documented beta blocker date and time   Airway Mallampati: II  TM Distance: >3 FB     Dental  (+) Chipped   Pulmonary Current Smoker,           Cardiovascular hypertension, Pt. on medications and Pt. on home beta blockers      Neuro/Psych    GI/Hepatic GERD  ,  Endo/Other    Renal/GU      Musculoskeletal   Abdominal   Peds  Hematology   Anesthesia Other Findings Denies CVA.  Reproductive/Obstetrics                            Anesthesia Physical Anesthesia Plan  ASA: III  Anesthesia Plan: General   Post-op Pain Management:    Induction: Intravenous  PONV Risk Score and Plan:   Airway Management Planned:   Additional Equipment:   Intra-op Plan:   Post-operative Plan:   Informed Consent: I have reviewed the patients History and Physical, chart, labs and discussed the procedure including the risks, benefits and alternatives for the proposed anesthesia with the patient or authorized representative who has indicated his/her understanding and acceptance.     Plan Discussed with: CRNA  Anesthesia Plan Comments:         Anesthesia Quick Evaluation

## 2017-12-06 NOTE — Anesthesia Procedure Notes (Signed)
Date/Time: 12/06/2017 4:01 PM Performed by: Johnna Acosta, CRNA Pre-anesthesia Checklist: Patient identified, Emergency Drugs available, Suction available, Patient being monitored and Timeout performed Patient Re-evaluated:Patient Re-evaluated prior to induction Oxygen Delivery Method: Nasal cannula Preoxygenation: Pre-oxygenation with 100% oxygen

## 2017-12-06 NOTE — Op Note (Signed)
[facility] Gastroenterology Patient Name: Kim Cook Procedure Date: 12/06/2017 3:52 PM MRN: [id number] Account #: [id number] Date of Birth: 01/05/1951 Admit Type: Outpatient Age: 67 Room: [facility] ENDO ROOM 1 Gender: Female Note Status: Finalized Procedure:            Colonoscopy Indications:          Screening for colorectal malignant neoplasm Providers:            Manya Silvas, MD Referring MD:         Leonie Douglas. Doy Hutching, MD (Referring MD) Medicines:            Propofol per Anesthesia Complications:        No immediate complications. Procedure:            Pre-Anesthesia Assessment:                       - After reviewing the risks and benefits, the patient                        was deemed in satisfactory condition to undergo the                        procedure.                       After obtaining informed consent, the colonoscope was                        passed under direct vision. Throughout the procedure,                        the patient's blood pressure, pulse, and oxygen                        saturations were monitored continuously. The                        Colonoscope was introduced through the anus and                        advanced to the the cecum, identified by appendiceal                        orifice and ileocecal valve. The colonoscopy was                        performed without difficulty. The patient tolerated the                        procedure well. The quality of the bowel preparation                        was adequate to identify polyps. Findings:      A diminutive polyp was found in the ascending colon. The polyp was       sessile. The polyp was removed with a cold snare. Resection and       retrieval were complete.      A diminutive polyp was found in the descending colon. The polyp was       sessile. The polyp was removed with a jumbo cold forceps. Resection and  retrieval were complete.      Multiple  medium-mouthed diverticula were found in the sigmoid colon and       descending colon.      Internal hemorrhoids were found during endoscopy. The hemorrhoids were       small. Impression:           - One diminutive polyp in the ascending colon, removed                        with a cold snare. Resected and retrieved.                       - One diminutive polyp in the descending colon, removed                        with a jumbo cold forceps. Resected and retrieved.                       - Diverticulosis in the sigmoid colon and in the                        descending colon.                       - Internal hemorrhoids. Recommendation:       - Await pathology results. Manya Silvas, MD 12/06/2017 4:33:56 PM This report has been signed electronically. Number of Addenda: 0 Note Initiated On: 12/06/2017 3:52 PM Scope Withdrawal Time: 0 hours 11 minutes 34 seconds  Total Procedure Duration: 0 hours 24 minutes 14 seconds       [facility]

## 2017-12-06 NOTE — Transfer of Care (Signed)
Immediate Anesthesia Transfer of Care Note  Patient: Kim Cook  Procedure(s) Performed: COLONOSCOPY WITH PROPOFOL (N/A )  Patient Location: PACU  Anesthesia Type:General  Level of Consciousness: sedated  Airway & Oxygen Therapy: Patient Spontanous Breathing  Post-op Assessment: Report given to RN and Post -op Vital signs reviewed and stable  Post vital signs: Reviewed and stable  Last Vitals:  Vitals Value Taken Time  BP 96/45 12/06/2017  4:31 PM  Temp 36.3 C 12/06/2017  4:30 PM  Pulse 68 12/06/2017  4:31 PM  Resp 17 12/06/2017  4:31 PM  SpO2 99 % 12/06/2017  4:31 PM  Vitals shown include unvalidated device data.  Last Pain:  Vitals:   12/06/17 1630  TempSrc: Tympanic  PainSc:       Patients Stated Pain Goal: 0 (88/33/74 4514)  Complications: No apparent anesthesia complications

## 2017-12-06 NOTE — Anesthesia Post-op Follow-up Note (Signed)
Anesthesia QCDR form completed.        

## 2017-12-07 ENCOUNTER — Encounter: Payer: Self-pay | Admitting: Unknown Physician Specialty

## 2017-12-07 NOTE — Anesthesia Postprocedure Evaluation (Signed)
Anesthesia Post Note  Patient: Kim Cook  Procedure(s) Performed: COLONOSCOPY WITH PROPOFOL (N/A )  Patient location during evaluation: Endoscopy Anesthesia Type: General Level of consciousness: awake and alert Pain management: pain level controlled Vital Signs Assessment: post-procedure vital signs reviewed and stable Respiratory status: spontaneous breathing, nonlabored ventilation, respiratory function stable and patient connected to nasal cannula oxygen Cardiovascular status: blood pressure returned to baseline and stable Postop Assessment: no apparent nausea or vomiting Anesthetic complications: no     Last Vitals:  Vitals:   12/06/17 1650 12/06/17 1700  BP: (!) 141/74 140/83  Pulse: 73 67  Resp: 18 (!) 23  Temp:    SpO2: 100% 100%    Last Pain:  Vitals:   12/06/17 1700  TempSrc:   PainSc: 0-No pain                 Rayshell Goecke S

## 2017-12-08 LAB — SURGICAL PATHOLOGY

## 2017-12-08 NOTE — H&P (Signed)
Primary Care Physician:  Idelle Crouch, MD Primary Gastroenterologist:  Dr. Vira Agar  Pre-Procedure History & Physical: HPI:  Kim Cook is a 67 y.o. female is here for an colonoscopy.  Last colonoscopy was done 09/27/2007.   Past Medical History:  Diagnosis Date  . GERD (gastroesophageal reflux disease)   . High cholesterol   . History of kidney stones   . Hypertension   . Osteoporosis   . RLS (restless legs syndrome)   . Vitamin D deficiency     Past Surgical History:  Procedure Laterality Date  . ABDOMINAL HYSTERECTOMY     Partial   . BREAST CYST ASPIRATION Left    neg  . COLONOSCOPY     06/15/00; 09/27/07  . COLONOSCOPY WITH PROPOFOL N/A 12/06/2017   Procedure: COLONOSCOPY WITH PROPOFOL;  Surgeon: Manya Silvas, MD;  [facility];  Service: Endoscopy;  Laterality: N/A;  . FOOT SURGERY  2000    Prior to Admission medications   Medication Sig Start Date End Date Taking? Authorizing Provider  alendronate (FOSAMAX) 10 MG tablet Take 10 mg by mouth once a week. Take with a full glass of water on an empty stomach.   Yes [provider]  alendronate (FOSAMAX) 70 MG tablet Take 70 mg by mouth once a week. Take with a full glass of water on an empty stomach.   Yes [provider]  aspirin 81 MG chewable tablet Chew by mouth daily.   Yes [provider]  atorvastatin (LIPITOR) 20 MG tablet Take 20 mg by mouth daily.   Yes [provider]  bisoprolol-hydrochlorothiazide (ZIAC) 10-6.25 MG tablet Take 1 tablet by mouth daily.   Yes [provider]  bisoprolol-hydrochlorothiazide (ZIAC) 2.5-6.25 MG tablet Take 1 tablet by mouth daily.   Yes [provider]  Calcium Carb-Cholecalciferol (CALCIUM 1000 + D PO) Take by mouth daily.   Yes [provider]  CHOLECALCIFEROL PO Take 2,000 Units by mouth daily.   Yes [provider]  HYDROcodone-acetaminophen (NORCO/VICODIN) 5-325 MG tablet 1-2  tabs po qd prn 09/11/17  Yes Conty, [place], MD  methocarbamol (ROBAXIN) 500 MG tablet Take 1 tablet (500 mg total) by mouth 4 (four) times daily. 09/13/16  Yes Lorin Picket, PA-C  naproxen (NAPROSYN) 375 MG tablet Take 1 tablet (375 mg total) by mouth 2 (two) times daily with a meal. 09/13/16  Yes Lorin Picket, PA-C  traZODone (DESYREL) 100 MG tablet Take 100 mg by mouth at bedtime.   Yes [provider]  cephALEXin (KEFLEX) 500 MG capsule Take 1 capsule (500 mg total) by mouth 2 (two) times daily. Patient not taking: Reported on 12/06/2017 09/13/16   Lorin Picket, PA-C    Allergies as of 10/29/2017  . (No Known Allergies)    Family History  Problem Relation Age of Onset  . Breast cancer Maternal Aunt   . Breast cancer Paternal Grandmother   . Breast cancer Cousin 40       paternal side  . Hypertension Mother   . Coronary artery disease Mother   . Heart attack Mother   . Hypertension Father   . Skin cancer Father   . Hypertension Son   . Osteoporosis Maternal Grandmother     Social History   Socioeconomic History  . Marital status: Married    Spouse name: Not on file  . Number of children: Not on file  . Years of education: Not on file  . Highest education level:  Not on file  Occupational History  . Not on file  Social Needs  . Financial resource strain: Not on file  . Food insecurity:    Worry: Not on file    Inability: Not on file  . Transportation needs:    Medical: Not on file    Non-medical: Not on file  Tobacco Use  . Smoking status: Current Every Day Smoker    Packs/day: 1.00    Years: 35.00    Pack years: 35.00    Types: Cigarettes  . Smokeless tobacco: Never Used  Substance and Sexual Activity  . Alcohol use: No  . Drug use: No  . Sexual activity: Not on file  Lifestyle  . Physical activity:    Days per week: Not on file    Minutes per session: Not on file  . Stress: Not on file  Relationships  . Social connections:    Talks on  phone: Not on file    Gets together: Not on file    Attends religious service: Not on file    Active member of club or organization: Not on file    Attends meetings of clubs or organizations: Not on file    Relationship status: Not on file  . Intimate partner violence:    Fear of current or ex partner: Not on file    Emotionally abused: Not on file    Physically abused: Not on file    Forced sexual activity: Not on file  Other Topics Concern  . Not on file  Social History Narrative  . Not on file    Review of Systems: See HPI, otherwise negative ROS  Physical Exam: BP 140/83   Pulse 67   Temp (!) 97.3 F (36.3 C) (Tympanic)   Resp (!) 23   Ht 5\' 4"  (1.626 m)   Wt 61.2 kg   SpO2 100%   BMI 23.17 kg/m  General:   Alert,  pleasant and cooperative in NAD Head:  Normocephalic and atraumatic. Neck:  Supple; no masses or thyromegaly. Lungs:  Clear throughout to auscultation.    Heart:  Regular rate and rhythm. Abdomen:  Soft, nontender and nondistended. Normal bowel sounds, without guarding, and without rebound.   Neurologic:  Alert and  oriented x4;  grossly normal neurologically.  Impression/Plan: Kim Cook is here for an colonoscopy to be performed for screening colonoscopy  Risks, benefits, limitations, and alternatives regarding  colonoscopy have been reviewed with the patient.  Questions have been answered.  All parties agreeable.   Gaylyn Cheers, MD  12/08/2017, 7:35 AM

## 2018-03-02 ENCOUNTER — Other Ambulatory Visit: Payer: Self-pay | Admitting: Internal Medicine

## 2018-03-02 DIAGNOSIS — Z1231 Encounter for screening mammogram for malignant neoplasm of breast: Secondary | ICD-10-CM

## 2018-04-04 DIAGNOSIS — E559 Vitamin D deficiency, unspecified: Secondary | ICD-10-CM | POA: Diagnosis not present

## 2018-04-04 DIAGNOSIS — E7800 Pure hypercholesterolemia, unspecified: Secondary | ICD-10-CM | POA: Diagnosis not present

## 2018-04-04 DIAGNOSIS — Z79899 Other long term (current) drug therapy: Secondary | ICD-10-CM | POA: Diagnosis not present

## 2018-04-04 DIAGNOSIS — F5104 Psychophysiologic insomnia: Secondary | ICD-10-CM | POA: Diagnosis not present

## 2018-04-04 DIAGNOSIS — Z720 Tobacco use: Secondary | ICD-10-CM | POA: Diagnosis not present

## 2018-04-04 DIAGNOSIS — Z0000 Encounter for general adult medical examination without abnormal findings: Secondary | ICD-10-CM | POA: Diagnosis not present

## 2018-04-04 DIAGNOSIS — Z1239 Encounter for other screening for malignant neoplasm of breast: Secondary | ICD-10-CM | POA: Diagnosis not present

## 2018-04-04 DIAGNOSIS — M810 Age-related osteoporosis without current pathological fracture: Secondary | ICD-10-CM | POA: Diagnosis not present

## 2018-04-04 DIAGNOSIS — I10 Essential (primary) hypertension: Secondary | ICD-10-CM | POA: Diagnosis not present

## 2018-04-18 ENCOUNTER — Ambulatory Visit: Payer: PPO

## 2018-05-17 ENCOUNTER — Ambulatory Visit: Payer: PPO

## 2018-07-19 ENCOUNTER — Ambulatory Visit
Admission: RE | Admit: 2018-07-19 | Discharge: 2018-07-19 | Disposition: A | Discharge status: Home / Self Care | Payer: PPO | Source: Ambulatory Visit | Attending: Internal Medicine | Admitting: Internal Medicine

## 2018-07-19 ENCOUNTER — Other Ambulatory Visit: Payer: Self-pay

## 2018-07-19 DIAGNOSIS — Z1231 Encounter for screening mammogram for malignant neoplasm of breast: Secondary | ICD-10-CM | POA: Diagnosis not present

## 2018-11-23 ENCOUNTER — Telehealth: Payer: Self-pay

## 2018-11-23 DIAGNOSIS — Z87891 Personal history of nicotine dependence: Secondary | ICD-10-CM

## 2018-11-23 DIAGNOSIS — Z122 Encounter for screening for malignant neoplasm of respiratory organs: Secondary | ICD-10-CM

## 2018-11-23 NOTE — Telephone Encounter (Signed)
Patient has been notified that lung cancer screening CT scan is due currently or will be in near future. Confirmed that patient is within the appropriate age range, and asymptomatic, (no signs or symptoms of lung cancer). Patient denies illness that would prevent curative treatment for lung cancer if found. Verified smoking history (current smoker 3/4 PPD currently). Patient is agreeable for CT scan being scheduled.She prefers that her appointment be 10:30 or later and not on a Thursday.

## 2018-11-24 NOTE — Telephone Encounter (Signed)
Smoking history current, 51.75 pack year

## 2018-11-24 NOTE — Addendum Note (Signed)
Addended by: Lieutenant Diego on: 11/24/2018 10:49 AM   Modules accepted: Orders

## 2018-11-29 ENCOUNTER — Ambulatory Visit
Admission: RE | Admit: 2018-11-29 | Discharge: 2018-11-29 | Disposition: A | Discharge status: Home / Self Care | Payer: PPO | Source: Ambulatory Visit | Attending: Nurse Practitioner | Admitting: Nurse Practitioner

## 2018-11-29 ENCOUNTER — Other Ambulatory Visit: Payer: Self-pay

## 2018-11-29 DIAGNOSIS — Z87891 Personal history of nicotine dependence: Secondary | ICD-10-CM | POA: Diagnosis not present

## 2018-11-29 DIAGNOSIS — Z122 Encounter for screening for malignant neoplasm of respiratory organs: Secondary | ICD-10-CM | POA: Diagnosis not present

## 2018-11-29 DIAGNOSIS — F17210 Nicotine dependence, cigarettes, uncomplicated: Secondary | ICD-10-CM | POA: Diagnosis not present

## 2018-12-01 ENCOUNTER — Encounter: Payer: Self-pay | Admitting: *Deleted

## 2019-04-05 ENCOUNTER — Other Ambulatory Visit: Payer: Self-pay | Admitting: Internal Medicine

## 2019-04-05 DIAGNOSIS — Z0000 Encounter for general adult medical examination without abnormal findings: Secondary | ICD-10-CM | POA: Diagnosis not present

## 2019-04-05 DIAGNOSIS — F5104 Psychophysiologic insomnia: Secondary | ICD-10-CM | POA: Diagnosis not present

## 2019-04-05 DIAGNOSIS — E7800 Pure hypercholesterolemia, unspecified: Secondary | ICD-10-CM | POA: Diagnosis not present

## 2019-04-05 DIAGNOSIS — Z79899 Other long term (current) drug therapy: Secondary | ICD-10-CM | POA: Diagnosis not present

## 2019-04-05 DIAGNOSIS — E559 Vitamin D deficiency, unspecified: Secondary | ICD-10-CM | POA: Diagnosis not present

## 2019-04-05 DIAGNOSIS — I10 Essential (primary) hypertension: Secondary | ICD-10-CM | POA: Diagnosis not present

## 2019-04-05 DIAGNOSIS — Z1231 Encounter for screening mammogram for malignant neoplasm of breast: Secondary | ICD-10-CM

## 2019-04-05 DIAGNOSIS — R8290 Unspecified abnormal findings in urine: Secondary | ICD-10-CM | POA: Diagnosis not present

## 2019-04-05 DIAGNOSIS — Z720 Tobacco use: Secondary | ICD-10-CM | POA: Diagnosis not present

## 2019-04-05 DIAGNOSIS — M810 Age-related osteoporosis without current pathological fracture: Secondary | ICD-10-CM | POA: Diagnosis not present

## 2019-04-18 DIAGNOSIS — H2513 Age-related nuclear cataract, bilateral: Secondary | ICD-10-CM | POA: Diagnosis not present

## 2019-04-24 DIAGNOSIS — M810 Age-related osteoporosis without current pathological fracture: Secondary | ICD-10-CM | POA: Diagnosis not present

## 2019-06-26 DIAGNOSIS — H2513 Age-related nuclear cataract, bilateral: Secondary | ICD-10-CM | POA: Diagnosis not present

## 2019-07-19 DIAGNOSIS — H2512 Age-related nuclear cataract, left eye: Secondary | ICD-10-CM | POA: Diagnosis not present

## 2019-07-19 DIAGNOSIS — I10 Essential (primary) hypertension: Secondary | ICD-10-CM | POA: Diagnosis not present

## 2019-07-20 ENCOUNTER — Other Ambulatory Visit: Payer: Self-pay

## 2019-07-20 ENCOUNTER — Ambulatory Visit
Admission: RE | Admit: 2019-07-20 | Discharge: 2019-07-20 | Disposition: A | Discharge status: Home / Self Care | Payer: PPO | Source: Ambulatory Visit | Attending: Internal Medicine | Admitting: Internal Medicine

## 2019-07-20 DIAGNOSIS — Z1231 Encounter for screening mammogram for malignant neoplasm of breast: Secondary | ICD-10-CM | POA: Diagnosis not present

## 2019-07-26 ENCOUNTER — Encounter: Payer: Self-pay | Admitting: Ophthalmology

## 2019-07-26 ENCOUNTER — Other Ambulatory Visit: Payer: Self-pay

## 2019-07-31 NOTE — Discharge Instructions (Signed)

## 2019-08-02 ENCOUNTER — Encounter: Payer: Self-pay | Admitting: Ophthalmology

## 2019-08-02 ENCOUNTER — Ambulatory Visit: Payer: PPO | Admitting: Anesthesiology

## 2019-08-02 ENCOUNTER — Other Ambulatory Visit: Payer: Self-pay

## 2019-08-02 ENCOUNTER — Ambulatory Visit
Admission: RE | Admit: 2019-08-02 | Discharge: 2019-08-02 | Disposition: A | Discharge status: Home / Self Care | Payer: PPO | Attending: Ophthalmology | Admitting: Ophthalmology

## 2019-08-02 ENCOUNTER — Encounter
Admission: RE | Disposition: A | Discharge status: Home / Self Care | Payer: Self-pay | Source: Home / Self Care | Attending: Ophthalmology

## 2019-08-02 DIAGNOSIS — I10 Essential (primary) hypertension: Secondary | ICD-10-CM | POA: Insufficient documentation

## 2019-08-02 DIAGNOSIS — M810 Age-related osteoporosis without current pathological fracture: Secondary | ICD-10-CM | POA: Insufficient documentation

## 2019-08-02 DIAGNOSIS — H2512 Age-related nuclear cataract, left eye: Secondary | ICD-10-CM | POA: Insufficient documentation

## 2019-08-02 DIAGNOSIS — Z79899 Other long term (current) drug therapy: Secondary | ICD-10-CM | POA: Diagnosis not present

## 2019-08-02 DIAGNOSIS — F17200 Nicotine dependence, unspecified, uncomplicated: Secondary | ICD-10-CM | POA: Diagnosis not present

## 2019-08-02 DIAGNOSIS — E7800 Pure hypercholesterolemia, unspecified: Secondary | ICD-10-CM | POA: Diagnosis not present

## 2019-08-02 DIAGNOSIS — H25812 Combined forms of age-related cataract, left eye: Secondary | ICD-10-CM | POA: Diagnosis not present

## 2019-08-02 DIAGNOSIS — Z7983 Long term (current) use of bisphosphonates: Secondary | ICD-10-CM | POA: Insufficient documentation

## 2019-08-02 DIAGNOSIS — Z7982 Long term (current) use of aspirin: Secondary | ICD-10-CM | POA: Insufficient documentation

## 2019-08-02 HISTORY — DX: Motion sickness, initial encounter: T75.3XXA

## 2019-08-02 HISTORY — PX: CATARACT EXTRACTION W/PHACO: SHX586

## 2019-08-02 SURGERY — PHACOEMULSIFICATION, CATARACT, WITH IOL INSERTION
Anesthesia: Monitor Anesthesia Care | Site: Eye | Laterality: Left

## 2019-08-02 MED ORDER — FENTANYL CITRATE (PF) 100 MCG/2ML IJ SOLN
INTRAMUSCULAR | Status: DC | PRN
  Administered 2019-08-02: 50 ug via INTRAVENOUS

## 2019-08-02 MED ORDER — LACTATED RINGERS IV SOLN: INTRAVENOUS | Status: DC

## 2019-08-02 MED ORDER — BRIMONIDINE TARTRATE-TIMOLOL 0.2-0.5 % OP SOLN
OPHTHALMIC | Status: DC | PRN
  Administered 2019-08-02: 1 [drp] via OPHTHALMIC

## 2019-08-02 MED ORDER — ACETAMINOPHEN 160 MG/5ML PO SOLN: 325.0000 mg | ORAL | Status: DC | PRN

## 2019-08-02 MED ORDER — SODIUM CHLORIDE FLUSH 0.9 % IV SOLN
INTRAVENOUS | Status: AC
  Filled 2019-08-02: qty 10

## 2019-08-02 MED ORDER — CEFUROXIME OPHTHALMIC INJECTION 1 MG/0.1 ML
INJECTION | OPHTHALMIC | Status: DC | PRN
  Administered 2019-08-02: 0.1 mL via INTRACAMERAL

## 2019-08-02 MED ORDER — ARMC OPHTHALMIC DILATING DROPS
1.0000 "application " | OPHTHALMIC | Status: DC | PRN
  Administered 2019-08-02 (×3): 1 via OPHTHALMIC

## 2019-08-02 MED ORDER — NA HYALUR & NA CHOND-NA HYALUR 0.4-0.35 ML IO KIT
PACK | INTRAOCULAR | Status: DC | PRN
  Administered 2019-08-02: 1 mL via INTRAOCULAR

## 2019-08-02 MED ORDER — LIDOCAINE HCL (PF) 2 % IJ SOLN
INTRAOCULAR | Status: DC | PRN
  Administered 2019-08-02: 1 mL

## 2019-08-02 MED ORDER — MOXIFLOXACIN HCL 0.5 % OP SOLN
1.0000 [drp] | OPHTHALMIC | Status: DC | PRN
  Administered 2019-08-02 (×3): 1 [drp] via OPHTHALMIC

## 2019-08-02 MED ORDER — ACETAMINOPHEN 325 MG PO TABS: 325.0000 mg | ORAL_TABLET | ORAL | Status: DC | PRN

## 2019-08-02 MED ORDER — EPINEPHRINE PF 1 MG/ML IJ SOLN
INTRAOCULAR | Status: DC | PRN
  Administered 2019-08-02: 70 mL via OPHTHALMIC

## 2019-08-02 MED ORDER — TETRACAINE HCL 0.5 % OP SOLN
1.0000 [drp] | OPHTHALMIC | Status: DC | PRN
  Administered 2019-08-02 (×3): 1 [drp] via OPHTHALMIC

## 2019-08-02 MED ORDER — MIDAZOLAM HCL 2 MG/2ML IJ SOLN
INTRAMUSCULAR | Status: DC | PRN
  Administered 2019-08-02: 1 mg via INTRAVENOUS

## 2019-08-02 SURGICAL SUPPLY — 29 items
CANNULA ANT/CHMB 27G (MISCELLANEOUS) ×1 IMPLANT
CANNULA ANT/CHMB 27GA (MISCELLANEOUS) ×3 IMPLANT
GLOVE SURG LX 7.5 STRW (GLOVE) ×4
GLOVE SURG LX STRL 7.5 STRW (GLOVE) ×1 IMPLANT
GLOVE SURG TRIUMPH 8.0 PF LTX (GLOVE) ×3 IMPLANT
GOWN STRL REUS W/ TWL LRG LVL3 (GOWN DISPOSABLE) ×2 IMPLANT
GOWN STRL REUS W/TWL LRG LVL3 (GOWN DISPOSABLE) ×6
LENS IOL DIOP 18.5 (Intraocular Lens) ×3 IMPLANT
LENS IOL TECNIS MONO 18.5 (Intraocular Lens) IMPLANT
MARKER SKIN DUAL TIP RULER LAB (MISCELLANEOUS) ×3 IMPLANT
NDL CAPSULORHEX 25GA (NEEDLE) ×1 IMPLANT
NDL FILTER BLUNT 18X1 1/2 (NEEDLE) ×2 IMPLANT
NDL RETROBULBAR .5 NSTRL (NEEDLE) IMPLANT
NEEDLE CAPSULORHEX 25GA (NEEDLE) ×3 IMPLANT
NEEDLE FILTER BLUNT 18X 1/2SAF (NEEDLE) ×4
NEEDLE FILTER BLUNT 18X1 1/2 (NEEDLE) ×2 IMPLANT
PACK CATARACT BRASINGTON (MISCELLANEOUS) ×3 IMPLANT
PACK EYE AFTER SURG (MISCELLANEOUS) ×3 IMPLANT
PACK OPTHALMIC (MISCELLANEOUS) ×3 IMPLANT
RING MALYGIN 7.0 (MISCELLANEOUS) IMPLANT
SOLUTION OPHTHALMIC SALT (MISCELLANEOUS) ×3 IMPLANT
SUT ETHILON 10-0 CS-B-6CS-B-6 (SUTURE)
SUT VICRYL  9 0 (SUTURE)
SUT VICRYL 9 0 (SUTURE) IMPLANT
SUTURE EHLN 10-0 CS-B-6CS-B-6 (SUTURE) IMPLANT
SYR 3ML LL SCALE MARK (SYRINGE) ×6 IMPLANT
SYR TB 1ML LUER SLIP (SYRINGE) ×3 IMPLANT
WATER STERILE IRR 250ML POUR (IV SOLUTION) ×3 IMPLANT
WIPE NON LINTING 3.25X3.25 (MISCELLANEOUS) ×3 IMPLANT

## 2019-08-02 NOTE — H&P (Signed)

## 2019-08-02 NOTE — Anesthesia Preprocedure Evaluation (Signed)
Anesthesia Evaluation  Patient identified by MRN, date of birth, ID band Patient awake    Reviewed: Allergy & Precautions, H&P , NPO status , Patient's Chart, lab work & pertinent test results, reviewed documented beta blocker date and time   Airway Mallampati: II  TM Distance: >3 FB Neck ROM: full    Dental no notable dental hx.    Pulmonary Current Smoker,    Pulmonary exam normal breath sounds clear to auscultation       Cardiovascular Exercise Tolerance: Good hypertension, Normal cardiovascular exam Rhythm:regular Rate:Normal     Neuro/Psych negative neurological ROS  negative psych ROS   GI/Hepatic Neg liver ROS, GERD  Controlled,  Endo/Other  negative endocrine ROS  Renal/GU negative Renal ROS  negative genitourinary   Musculoskeletal   Abdominal   Peds  Hematology negative hematology ROS (+)   Anesthesia Other Findings   Reproductive/Obstetrics negative OB ROS                             Anesthesia Physical Anesthesia Plan  ASA: II  Anesthesia Plan: MAC   Post-op Pain Management:    Induction:   PONV Risk Score and Plan:   Airway Management Planned:   Additional Equipment:   Intra-op Plan:   Post-operative Plan:   Informed Consent: I have reviewed the patients History and Physical, chart, labs and discussed the procedure including the risks, benefits and alternatives for the proposed anesthesia with the patient or authorized representative who has indicated his/her understanding and acceptance.     Dental Advisory Given  Plan Discussed with: CRNA  Anesthesia Plan Comments:         Anesthesia Quick Evaluation

## 2019-08-02 NOTE — Anesthesia Postprocedure Evaluation (Signed)
Anesthesia Post Note  Patient: Kim Cook  Procedure(s) Performed: CATARACT EXTRACTION PHACO AND INTRAOCULAR LENS PLACEMENT (IOC) LEFT (Left Eye)     Patient location during evaluation: PACU Anesthesia Type: MAC Level of consciousness: awake and alert Pain management: pain level controlled Vital Signs Assessment: post-procedure vital signs reviewed and stable Respiratory status: spontaneous breathing, nonlabored ventilation, respiratory function stable and patient connected to nasal cannula oxygen Cardiovascular status: stable and blood pressure returned to baseline Postop Assessment: no apparent nausea or vomiting Anesthetic complications: no   No complications documented.  Trecia Rogers

## 2019-08-02 NOTE — Op Note (Signed)
OPERATIVE NOTE  Kim Cook [id number] 08/02/2019   PREOPERATIVE DIAGNOSIS:  Nuclear sclerotic cataract left eye. H25.12   POSTOPERATIVE DIAGNOSIS:    Nuclear sclerotic cataract left eye.     PROCEDURE:  Phacoemusification with posterior chamber intraocular lens placement of the left eye  Ultrasound time: Procedure(s) with comments: CATARACT EXTRACTION PHACO AND INTRAOCULAR LENS PLACEMENT (IOC) LEFT (Left) - 5.46 0:56.5 9.7%  LENS:   Implant Name Type Inv. Item Serial No. Manufacturer Lot No. LRB No. Used Action  LENS IOL DIOP 18.5 - [id number] Intraocular Lens LENS IOL DIOP 18.5 [id number] [employer]  Left 1 Implanted      SURGEON:  Wyonia Hough, MD   ANESTHESIA:  Topical with tetracaine drops and 2% Xylocaine jelly, augmented with 1% preservative-free intracameral lidocaine.    COMPLICATIONS:  None.   DESCRIPTION OF PROCEDURE:  The patient was identified in the holding room and transported to the operating room and placed in the supine position under the operating microscope.  The left eye was identified as the operative eye and it was prepped and draped in the usual sterile ophthalmic fashion.   A 1 millimeter clear-corneal paracentesis was made at the 1:30 position.  0.5 ml of preservative-free 1% lidocaine was injected into the anterior chamber.  The anterior chamber was filled with Viscoat viscoelastic.  A 2.4 millimeter keratome was used to make a near-clear corneal incision at the 10:30 position.  .  A curvilinear capsulorrhexis was made with a cystotome and capsulorrhexis forceps.  Balanced salt solution was used to hydrodissect and hydrodelineate the nucleus.   Phacoemulsification was then used in stop and chop fashion to remove the lens nucleus and epinucleus.  The remaining cortex was then removed using the irrigation and aspiration handpiece. Provisc was then placed into the capsular bag to distend it for lens placement.  A lens was then  injected into the capsular bag.  The remaining viscoelastic was aspirated.   Wounds were hydrated with balanced salt solution.  The anterior chamber was inflated to a physiologic pressure with balanced salt solution.  No wound leaks were noted. Cefuroxime 0.1 ml of a 10mg /ml solution was injected into the anterior chamber for a dose of 1 mg of intracameral antibiotic at the completion of the case.   Timolol and Brimonidine drops were applied to the eye.  The patient was taken to the recovery room in stable condition without complications of anesthesia or surgery.  [name] 08/02/2019, 2:32 PM

## 2019-08-02 NOTE — Anesthesia Procedure Notes (Signed)
Procedure Name: MAC Date/Time: 08/02/2019 2:12 PM Performed by: Silvana Newness, CRNA Pre-anesthesia Checklist: Patient identified, Emergency Drugs available, Suction available, Patient being monitored and Timeout performed Patient Re-evaluated:Patient Re-evaluated prior to induction Oxygen Delivery Method: Nasal cannula

## 2019-08-02 NOTE — Transfer of Care (Signed)
Immediate Anesthesia Transfer of Care Note  Patient: Kim Cook  Procedure(s) Performed: CATARACT EXTRACTION PHACO AND INTRAOCULAR LENS PLACEMENT (IOC) LEFT (Left Eye)  Patient Location: PACU  Anesthesia Type: MAC  Level of Consciousness: awake, alert  and patient cooperative  Airway and Oxygen Therapy: Patient Spontanous Breathing and Patient connected to supplemental oxygen  Post-op Assessment: Post-op Vital signs reviewed, Patient's Cardiovascular Status Stable, Respiratory Function Stable, Patent Airway and No signs of Nausea or vomiting  Post-op Vital Signs: Reviewed and stable  Complications: No complications documented.

## 2019-08-03 ENCOUNTER — Encounter: Payer: Self-pay | Admitting: Ophthalmology

## 2019-08-17 DIAGNOSIS — I10 Essential (primary) hypertension: Secondary | ICD-10-CM | POA: Diagnosis not present

## 2019-08-17 DIAGNOSIS — H2511 Age-related nuclear cataract, right eye: Secondary | ICD-10-CM | POA: Diagnosis not present

## 2019-08-23 ENCOUNTER — Other Ambulatory Visit: Payer: Self-pay

## 2019-08-23 ENCOUNTER — Encounter: Payer: Self-pay | Admitting: Ophthalmology

## 2019-08-28 ENCOUNTER — Other Ambulatory Visit: Payer: Self-pay

## 2019-08-28 ENCOUNTER — Other Ambulatory Visit
Admission: RE | Admit: 2019-08-28 | Discharge: 2019-08-28 | Disposition: A | Discharge status: Home / Self Care | Payer: PPO | Source: Ambulatory Visit | Attending: Ophthalmology | Admitting: Ophthalmology

## 2019-08-28 DIAGNOSIS — Z20822 Contact with and (suspected) exposure to covid-19: Secondary | ICD-10-CM | POA: Diagnosis not present

## 2019-08-28 DIAGNOSIS — Z01812 Encounter for preprocedural laboratory examination: Secondary | ICD-10-CM | POA: Diagnosis not present

## 2019-08-28 LAB — SARS CORONAVIRUS 2 (TAT 6-24 HRS): SARS Coronavirus 2: NEGATIVE

## 2019-08-28 NOTE — Discharge Instructions (Signed)
Cataract Surgery, Care After This sheet gives you information about how to care for yourself after your procedure. Your health care provider may also give you more specific instructions. If you have problems or questions, contact your health care provider. What can I expect after the procedure? After the procedure, it is common to have:  Itching.  Discomfort.  Fluid discharge.  Sensitivity to light and to touch.  Bruising in or around the eye.  Mild blurred vision. Follow these instructions at home: Eye care   Do not touch or rub your eyes.  Protect your eyes as told by your health care provider. You may be told to wear a protective eye shield or sunglasses.  Do not put a contact lens into the affected eye or eyes until your health care provider approves.  Keep the area around your eye clean and dry: ? Avoid swimming. ? Do not allow water to hit you directly in the face while showering. ? Keep soap and shampoo out of your eyes.  Check your eye every day for signs of infection. Watch for: ? Redness, swelling, or pain. ? Fluid, blood, or pus. ? Warmth. ? A bad smell. ? Vision that is getting worse. ? Sensitivity that is getting worse. Activity  Do not drive for 24 hours if you were given a sedative during your procedure.  Avoid strenuous activities, such as playing contact sports, for as long as told by your health care provider.  Do not drive or use heavy machinery until your health care provider approves.  Do not bend or lift heavy objects. Bending increases pressure in the eye. You can walk, climb stairs, and do light household chores.  Ask your health care provider when you can return to work. If you work in a dusty environment, you may be advised to wear protective eyewear for a period of time. General instructions  Take or apply over-the-counter and prescription medicines only as told by your health care provider. This includes eye drops.  Keep all follow-up  visits as told by your health care provider. This is important. Contact a health care provider if:  You have increased bruising around your eye.  You have pain that is not helped with medicine.  You have a fever.  You have redness, swelling, or pain in your eye.  You have fluid, blood, or pus coming from your incision.  Your vision gets worse.  Your sensitivity to light gets worse. Get help right away if:  You have sudden loss of vision.  You see flashes of light or spots (floaters).  You have severe eye pain.  You develop nausea or vomiting. Summary  After your procedure, it is common to have itching, discomfort, bruising, fluid discharge, or sensitivity to light.  Follow instructions from your health care provider about caring for your eye after the procedure.  Do not rub your eye after the procedure. You may need to wear eye protection or sunglasses. Do not wear contact lenses. Keep the area around your eye clean and dry.  Avoid activities that require a lot of effort. These include playing sports and lifting heavy objects.  Contact a health care provider if you have increased bruising, pain that does not go away, or a fever. Get help right away if you suddenly lose your vision, see flashes of light or spots, or have severe pain in the eye. This information is not intended to replace advice given to you by your health care provider. Make sure you discuss  any questions you have with your health care provider. Document Revised: 10/25/2018 Document Reviewed: 06/28/2017 Elsevier Patient Education  2020 [place]. Cataract Surgery, Care After This sheet gives you information about how to care for yourself after your procedure. Your health care provider may also give you more specific instructions. If you have problems or questions, contact your health care provider. What can I expect after the procedure? After the procedure, it is common to  have:  Itching.  Discomfort.  Fluid discharge.  Sensitivity to light and to touch.  Bruising in or around the eye.  Mild blurred vision. Follow these instructions at home: Eye care   Do not touch or rub your eyes.  Protect your eyes as told by your health care provider. You may be told to wear a protective eye shield or sunglasses.  Do not put a contact lens into the affected eye or eyes until your health care provider approves.  Keep the area around your eye clean and dry: ? Avoid swimming. ? Do not allow water to hit you directly in the face while showering. ? Keep soap and shampoo out of your eyes.  Check your eye every day for signs of infection. Watch for: ? Redness, swelling, or pain. ? Fluid, blood, or pus. ? Warmth. ? A bad smell. ? Vision that is getting worse. ? Sensitivity that is getting worse. Activity  Do not drive for 24 hours if you were given a sedative during your procedure.  Avoid strenuous activities, such as playing contact sports, for as long as told by your health care provider.  Do not drive or use heavy machinery until your health care provider approves.  Do not bend or lift heavy objects. Bending increases pressure in the eye. You can walk, climb stairs, and do light household chores.  Ask your health care provider when you can return to work. If you work in a dusty environment, you may be advised to wear protective eyewear for a period of time. General instructions  Take or apply over-the-counter and prescription medicines only as told by your health care provider. This includes eye drops.  Keep all follow-up visits as told by your health care provider. This is important. Contact a health care provider if:  You have increased bruising around your eye.  You have pain that is not helped with medicine.  You have a fever.  You have redness, swelling, or pain in your eye.  You have fluid, blood, or pus coming from your  incision.  Your vision gets worse.  Your sensitivity to light gets worse. Get help right away if:  You have sudden loss of vision.  You see flashes of light or spots (floaters).  You have severe eye pain.  You develop nausea or vomiting. Summary  After your procedure, it is common to have itching, discomfort, bruising, fluid discharge, or sensitivity to light.  Follow instructions from your health care provider about caring for your eye after the procedure.  Do not rub your eye after the procedure. You may need to wear eye protection or sunglasses. Do not wear contact lenses. Keep the area around your eye clean and dry.  Avoid activities that require a lot of effort. These include playing sports and lifting heavy objects.  Contact a health care provider if you have increased bruising, pain that does not go away, or a fever. Get help right away if you suddenly lose your vision, see flashes of light or spots, or have severe pain  in the eye. This information is not intended to replace advice given to you by your health care provider. Make sure you discuss any questions you have with your health care provider. Document Revised: 10/25/2018 Document Reviewed: 06/28/2017 Elsevier Patient Education  [place].

## 2019-08-30 ENCOUNTER — Other Ambulatory Visit: Payer: Self-pay

## 2019-08-30 ENCOUNTER — Ambulatory Visit
Admission: RE | Admit: 2019-08-30 | Discharge: 2019-08-30 | Disposition: A | Discharge status: Home / Self Care | Payer: PPO | Attending: Ophthalmology | Admitting: Ophthalmology

## 2019-08-30 ENCOUNTER — Ambulatory Visit: Payer: PPO | Admitting: Anesthesiology

## 2019-08-30 ENCOUNTER — Encounter: Payer: Self-pay | Admitting: Ophthalmology

## 2019-08-30 ENCOUNTER — Encounter
Admission: RE | Disposition: A | Discharge status: Home / Self Care | Payer: Self-pay | Source: Home / Self Care | Attending: Ophthalmology

## 2019-08-30 DIAGNOSIS — M810 Age-related osteoporosis without current pathological fracture: Secondary | ICD-10-CM | POA: Diagnosis not present

## 2019-08-30 DIAGNOSIS — M1990 Unspecified osteoarthritis, unspecified site: Secondary | ICD-10-CM | POA: Diagnosis not present

## 2019-08-30 DIAGNOSIS — Z7983 Long term (current) use of bisphosphonates: Secondary | ICD-10-CM | POA: Diagnosis not present

## 2019-08-30 DIAGNOSIS — H2511 Age-related nuclear cataract, right eye: Secondary | ICD-10-CM | POA: Diagnosis not present

## 2019-08-30 DIAGNOSIS — Z79899 Other long term (current) drug therapy: Secondary | ICD-10-CM | POA: Diagnosis not present

## 2019-08-30 DIAGNOSIS — H25811 Combined forms of age-related cataract, right eye: Secondary | ICD-10-CM | POA: Diagnosis not present

## 2019-08-30 DIAGNOSIS — F17200 Nicotine dependence, unspecified, uncomplicated: Secondary | ICD-10-CM | POA: Insufficient documentation

## 2019-08-30 DIAGNOSIS — Z7982 Long term (current) use of aspirin: Secondary | ICD-10-CM | POA: Diagnosis not present

## 2019-08-30 DIAGNOSIS — Z9842 Cataract extraction status, left eye: Secondary | ICD-10-CM | POA: Diagnosis not present

## 2019-08-30 DIAGNOSIS — E7800 Pure hypercholesterolemia, unspecified: Secondary | ICD-10-CM | POA: Diagnosis not present

## 2019-08-30 DIAGNOSIS — I10 Essential (primary) hypertension: Secondary | ICD-10-CM | POA: Insufficient documentation

## 2019-08-30 HISTORY — PX: CATARACT EXTRACTION W/PHACO: SHX586

## 2019-08-30 SURGERY — PHACOEMULSIFICATION, CATARACT, WITH IOL INSERTION
Anesthesia: Monitor Anesthesia Care | Site: Eye | Laterality: Right

## 2019-08-30 MED ORDER — MIDAZOLAM HCL 2 MG/2ML IJ SOLN
INTRAMUSCULAR | Status: DC | PRN
  Administered 2019-08-30: 2 mg via INTRAVENOUS

## 2019-08-30 MED ORDER — TETRACAINE HCL 0.5 % OP SOLN
1.0000 [drp] | OPHTHALMIC | Status: DC | PRN
  Administered 2019-08-30 (×3): 1 [drp] via OPHTHALMIC

## 2019-08-30 MED ORDER — ARMC OPHTHALMIC DILATING DROPS
1.0000 "application " | OPHTHALMIC | Status: DC | PRN
  Administered 2019-08-30 (×3): 1 via OPHTHALMIC

## 2019-08-30 MED ORDER — LIDOCAINE HCL (PF) 2 % IJ SOLN
INTRAOCULAR | Status: DC | PRN
  Administered 2019-08-30: 1 mL

## 2019-08-30 MED ORDER — FENTANYL CITRATE (PF) 100 MCG/2ML IJ SOLN
INTRAMUSCULAR | Status: DC | PRN
  Administered 2019-08-30: 50 ug via INTRAVENOUS

## 2019-08-30 MED ORDER — EPINEPHRINE PF 1 MG/ML IJ SOLN
INTRAOCULAR | Status: DC | PRN
  Administered 2019-08-30: 65 mL via OPHTHALMIC

## 2019-08-30 MED ORDER — CEFUROXIME OPHTHALMIC INJECTION 1 MG/0.1 ML
INJECTION | OPHTHALMIC | Status: DC | PRN
  Administered 2019-08-30: 0.1 mL via INTRACAMERAL

## 2019-08-30 MED ORDER — MOXIFLOXACIN HCL 0.5 % OP SOLN
1.0000 [drp] | OPHTHALMIC | Status: DC | PRN
  Administered 2019-08-30 (×3): 1 [drp] via OPHTHALMIC

## 2019-08-30 MED ORDER — NA HYALUR & NA CHOND-NA HYALUR 0.4-0.35 ML IO KIT
PACK | INTRAOCULAR | Status: DC | PRN
  Administered 2019-08-30: 1 mL via INTRAOCULAR

## 2019-08-30 MED ORDER — BRIMONIDINE TARTRATE-TIMOLOL 0.2-0.5 % OP SOLN
OPHTHALMIC | Status: DC | PRN
  Administered 2019-08-30: 1 [drp] via OPHTHALMIC

## 2019-08-30 MED ORDER — LACTATED RINGERS IV SOLN: INTRAVENOUS | Status: DC

## 2019-08-30 SURGICAL SUPPLY — 29 items
CANNULA ANT/CHMB 27G (MISCELLANEOUS) ×1 IMPLANT
CANNULA ANT/CHMB 27GA (MISCELLANEOUS) ×3 IMPLANT
GLOVE SURG LX 7.5 STRW (GLOVE) ×6
GLOVE SURG LX STRL 7.5 STRW (GLOVE) ×1 IMPLANT
GLOVE SURG TRIUMPH 8.0 PF LTX (GLOVE) ×3 IMPLANT
GOWN STRL REUS W/ TWL LRG LVL3 (GOWN DISPOSABLE) ×2 IMPLANT
GOWN STRL REUS W/TWL LRG LVL3 (GOWN DISPOSABLE) ×9
LENS IOL DIOP 21.0 (Intraocular Lens) ×3 IMPLANT
LENS IOL TECNIS MONO 21.0 (Intraocular Lens) IMPLANT
MARKER SKIN DUAL TIP RULER LAB (MISCELLANEOUS) ×3 IMPLANT
NDL CAPSULORHEX 25GA (NEEDLE) ×1 IMPLANT
NDL FILTER BLUNT 18X1 1/2 (NEEDLE) ×2 IMPLANT
NDL RETROBULBAR .5 NSTRL (NEEDLE) IMPLANT
NEEDLE CAPSULORHEX 25GA (NEEDLE) ×3 IMPLANT
NEEDLE FILTER BLUNT 18X 1/2SAF (NEEDLE) ×4
NEEDLE FILTER BLUNT 18X1 1/2 (NEEDLE) ×2 IMPLANT
PACK CATARACT BRASINGTON (MISCELLANEOUS) ×3 IMPLANT
PACK EYE AFTER SURG (MISCELLANEOUS) ×3 IMPLANT
PACK OPTHALMIC (MISCELLANEOUS) ×3 IMPLANT
RING MALYGIN 7.0 (MISCELLANEOUS) IMPLANT
SOLUTION OPHTHALMIC SALT (MISCELLANEOUS) ×3 IMPLANT
SUT ETHILON 10-0 CS-B-6CS-B-6 (SUTURE)
SUT VICRYL  9 0 (SUTURE)
SUT VICRYL 9 0 (SUTURE) IMPLANT
SUTURE EHLN 10-0 CS-B-6CS-B-6 (SUTURE) IMPLANT
SYR 3ML LL SCALE MARK (SYRINGE) ×6 IMPLANT
SYR TB 1ML LUER SLIP (SYRINGE) ×3 IMPLANT
WATER STERILE IRR 250ML POUR (IV SOLUTION) ×3 IMPLANT
WIPE NON LINTING 3.25X3.25 (MISCELLANEOUS) ×3 IMPLANT

## 2019-08-30 NOTE — Transfer of Care (Signed)
Immediate Anesthesia Transfer of Care Note  Patient: Kim Cook  Procedure(s) Performed: CATARACT EXTRACTION PHACO AND INTRAOCULAR LENS PLACEMENT (IOC) RIGHT (Right Eye)  Patient Location: PACU  Anesthesia Type: MAC  Level of Consciousness: awake, alert  and patient cooperative  Airway and Oxygen Therapy: Patient Spontanous Breathing and Patient connected to supplemental oxygen  Post-op Assessment: Post-op Vital signs reviewed, Patient's Cardiovascular Status Stable, Respiratory Function Stable, Patent Airway and No signs of Nausea or vomiting  Post-op Vital Signs: Reviewed and stable  Complications: No complications documented.

## 2019-08-30 NOTE — H&P (Signed)

## 2019-08-30 NOTE — Anesthesia Preprocedure Evaluation (Signed)
Anesthesia Evaluation  Patient identified by MRN, date of birth, ID band Patient awake    Reviewed: Allergy & Precautions, H&P , NPO status , Patient's Chart, lab work & pertinent test results, reviewed documented beta blocker date and time   Airway Mallampati: II  TM Distance: >3 FB Neck ROM: full    Dental no notable dental hx.    Pulmonary Current Smoker,    Pulmonary exam normal        Cardiovascular Exercise Tolerance: Good hypertension, Normal cardiovascular exam     Neuro/Psych negative neurological ROS  negative psych ROS   GI/Hepatic Neg liver ROS, GERD  Controlled,  Endo/Other  negative endocrine ROS  Renal/GU negative Renal ROS  negative genitourinary   Musculoskeletal negative musculoskeletal ROS (+)   Abdominal Normal abdominal exam  (+)   Peds  Hematology negative hematology ROS (+)   Anesthesia Other Findings   Reproductive/Obstetrics negative OB ROS                             Anesthesia Physical  Anesthesia Plan  ASA: II  Anesthesia Plan: MAC   Post-op Pain Management:    Induction:   PONV Risk Score and Plan: 1 and TIVA, Midazolam and Treatment may vary due to age or medical condition  Airway Management Planned: Nasal Cannula  Additional Equipment: None  Intra-op Plan:   Post-operative Plan:   Informed Consent: I have reviewed the patients History and Physical, chart, labs and discussed the procedure including the risks, benefits and alternatives for the proposed anesthesia with the patient or authorized representative who has indicated his/her understanding and acceptance.     Dental Advisory Given  Plan Discussed with: CRNA  Anesthesia Plan Comments:         Anesthesia Quick Evaluation

## 2019-08-30 NOTE — Op Note (Signed)
LOCATION:  [place]   PREOPERATIVE DIAGNOSIS:    Nuclear sclerotic cataract right eye. H25.11   POSTOPERATIVE DIAGNOSIS:  Nuclear sclerotic cataract right eye.     PROCEDURE:  Phacoemusification with posterior chamber intraocular lens placement of the right eye   ULTRASOUND TIME: Procedure(s) with comments: CATARACT EXTRACTION PHACO AND INTRAOCULAR LENS PLACEMENT (IOC) RIGHT (Right) - 5.53 0:51.0 10.8%  LENS:   Implant Name Type Inv. Item Serial No. Manufacturer Lot No. LRB No. Used Action  LENS IOL DIOP 21.0 - [id number] Intraocular Lens LENS IOL DIOP 21.0 [id number] [employer]  Right 1 Implanted         SURGEON:  Wyonia Hough, MD   ANESTHESIA:  Topical with tetracaine drops and 2% Xylocaine jelly, augmented with 1% preservative-free intracameral lidocaine.    COMPLICATIONS:  None.   DESCRIPTION OF PROCEDURE:  The patient was identified in the holding room and transported to the operating room and placed in the supine position under the operating microscope.  The right eye was identified as the operative eye and it was prepped and draped in the usual sterile ophthalmic fashion.   A 1 millimeter clear-corneal paracentesis was made at the 12:00 position.  0.5 ml of preservative-free 1% lidocaine was injected into the anterior chamber. The anterior chamber was filled with Viscoat viscoelastic.  A 2.4 millimeter keratome was used to make a near-clear corneal incision at the 9:00 position.  A curvilinear capsulorrhexis was made with a cystotome and capsulorrhexis forceps.  Balanced salt solution was used to hydrodissect and hydrodelineate the nucleus.   Phacoemulsification was then used in stop and chop fashion to remove the lens nucleus and epinucleus.  The remaining cortex was then removed using the irrigation and aspiration handpiece. Provisc was then placed into the capsular bag to distend it for lens placement.  A lens was then injected into the  capsular bag.  The remaining viscoelastic was aspirated.   Wounds were hydrated with balanced salt solution.  The anterior chamber was inflated to a physiologic pressure with balanced salt solution.  No wound leaks were noted. Cefuroxime 0.1 ml of a 10mg /ml solution was injected into the anterior chamber for a dose of 1 mg of intracameral antibiotic at the completion of the case.   Timolol and Brimonidine drops were applied to the eye.  The patient was taken to the recovery room in stable condition without complications of anesthesia or surgery.   Tykee Heideman 08/30/2019, 12:38 PM

## 2019-08-30 NOTE — Anesthesia Postprocedure Evaluation (Signed)
Anesthesia Post Note  Patient: Kim Cook  Procedure(s) Performed: CATARACT EXTRACTION PHACO AND INTRAOCULAR LENS PLACEMENT (IOC) RIGHT (Right Eye)     Anesthesia Post Evaluation No complications documented.  Aarib Pulido [employer]

## 2019-08-30 NOTE — Anesthesia Procedure Notes (Signed)
Procedure Name: MAC Date/Time: 08/30/2019 12:25 PM Performed by: Silvana Newness, CRNA Pre-anesthesia Checklist: Patient identified, Emergency Drugs available, Suction available, Patient being monitored and Timeout performed Patient Re-evaluated:Patient Re-evaluated prior to induction Oxygen Delivery Method: Nasal cannula Placement Confirmation: positive ETCO2

## 2019-08-31 ENCOUNTER — Encounter: Payer: Self-pay | Admitting: Ophthalmology

## 2019-11-22 ENCOUNTER — Other Ambulatory Visit: Payer: Self-pay | Admitting: *Deleted

## 2019-11-22 DIAGNOSIS — Z122 Encounter for screening for malignant neoplasm of respiratory organs: Secondary | ICD-10-CM

## 2019-11-22 DIAGNOSIS — Z87891 Personal history of nicotine dependence: Secondary | ICD-10-CM

## 2019-11-22 NOTE — Progress Notes (Signed)
Contacted and scheduled for annual lung screening scan. Current smoker, 52.5 pack year history.

## 2019-12-13 ENCOUNTER — Ambulatory Visit
Admission: RE | Admit: 2019-12-13 | Discharge: 2019-12-13 | Disposition: A | Discharge status: Home / Self Care | Payer: PPO | Source: Ambulatory Visit | Attending: Nurse Practitioner | Admitting: Nurse Practitioner

## 2019-12-13 ENCOUNTER — Other Ambulatory Visit: Payer: Self-pay

## 2019-12-13 DIAGNOSIS — Z122 Encounter for screening for malignant neoplasm of respiratory organs: Secondary | ICD-10-CM | POA: Insufficient documentation

## 2019-12-13 DIAGNOSIS — Z87891 Personal history of nicotine dependence: Secondary | ICD-10-CM | POA: Insufficient documentation

## 2019-12-22 ENCOUNTER — Encounter: Payer: Self-pay | Admitting: *Deleted

## 2020-01-13 DIAGNOSIS — C50919 Malignant neoplasm of unspecified site of unspecified female breast: Secondary | ICD-10-CM

## 2020-01-13 HISTORY — PX: MASTECTOMY: SHX3

## 2020-01-13 HISTORY — DX: Malignant neoplasm of unspecified site of unspecified female breast: C50.919

## 2020-03-11 IMAGING — CT CT CHEST LUNG CANCER SCREENING LOW DOSE W/O CM
2 of 5 series · 14 of 40 positions shown, 17 images · non-contrast
Comparison: Chest CT 04/05/2014.

CLINICAL DATA: 67-year-old female current smoker with 51 pack-year
history of smoking. Lung cancer screening examination.

EXAM:
CT CHEST WITHOUT CONTRAST LOW-DOSE FOR LUNG CANCER SCREENING
TECHNIQUE: Multidetector CT imaging of the chest was performed following the
standard protocol without IV contrast.

[Series 3: lung · axial · 0.62mm/px · z∈[-1371,-1038]mm · 11 of 369 slices shown, 14 images (1 of 2)]
[im 18/369  mediastinal]
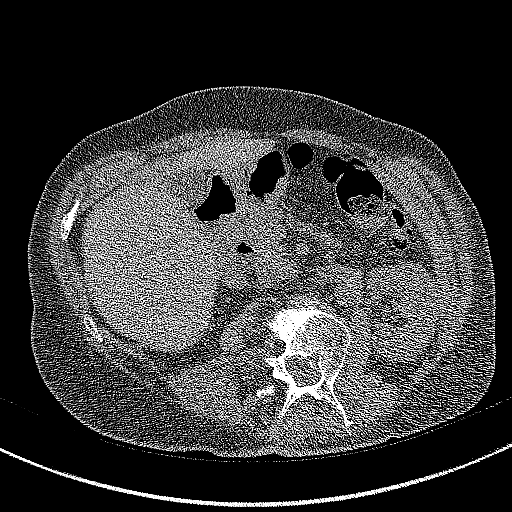
[im 18/369  lung]
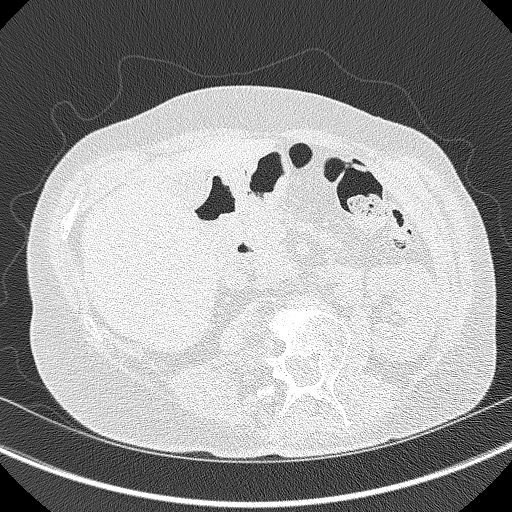
[im 53/369  lung]
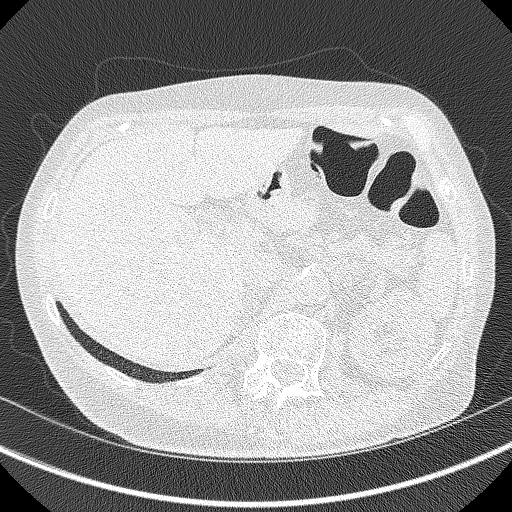
[im 88/369  lung]
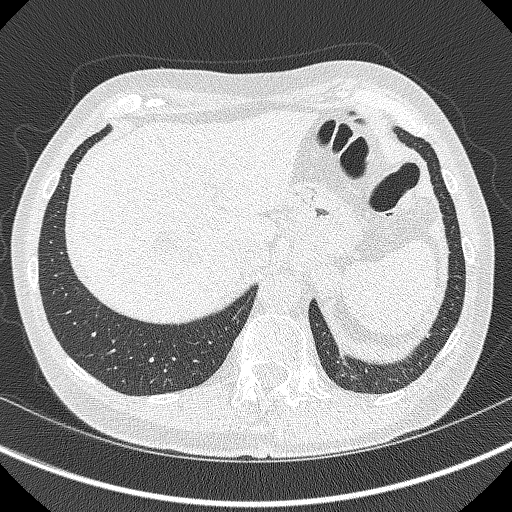
[im 123/369  lung]
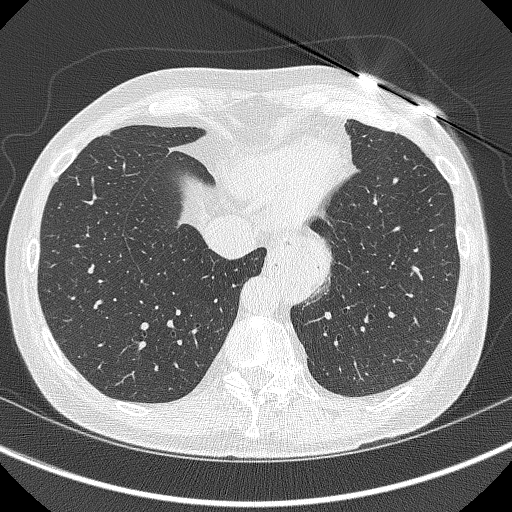
[im 158/369  mediastinal]
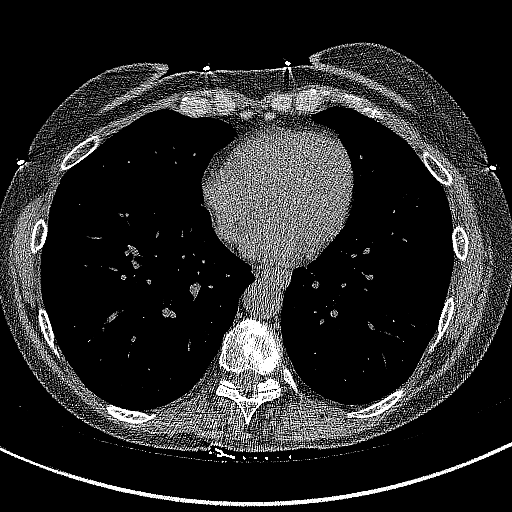
[im 158/369  lung]
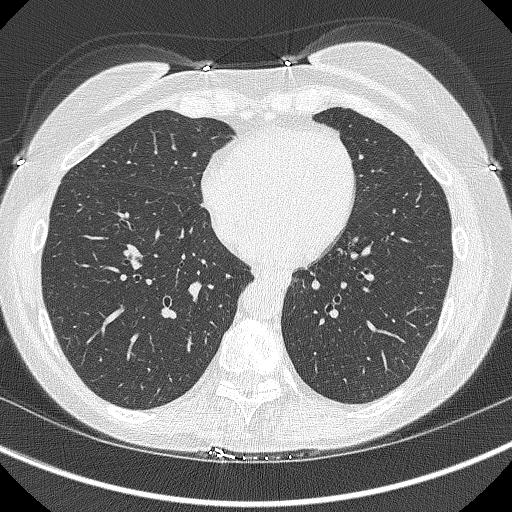
[im 193/369  lung]
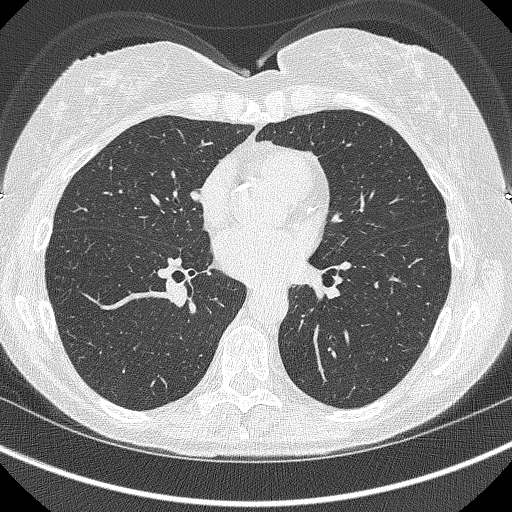
[im 211/369  lung]
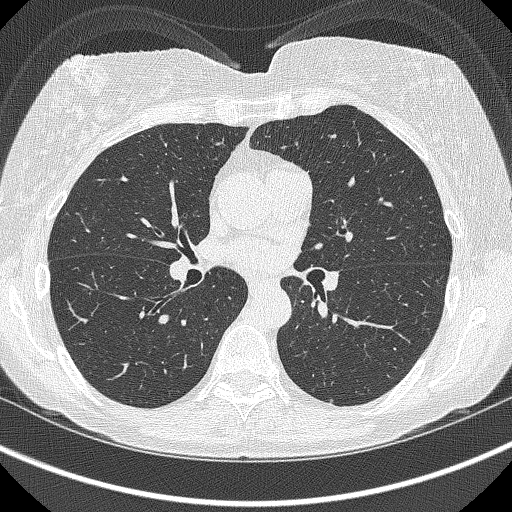
[im 246/369  lung]
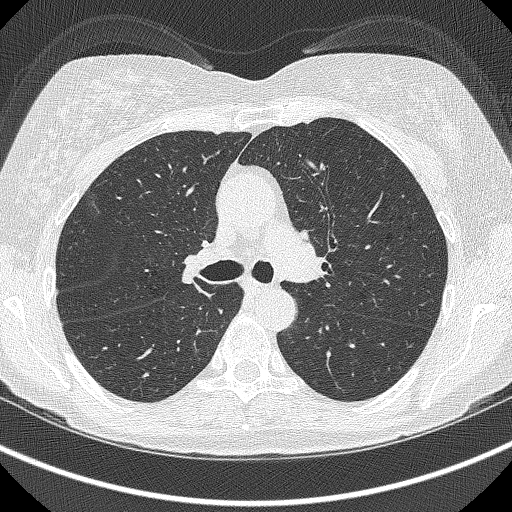
[im 281/369  mediastinal]
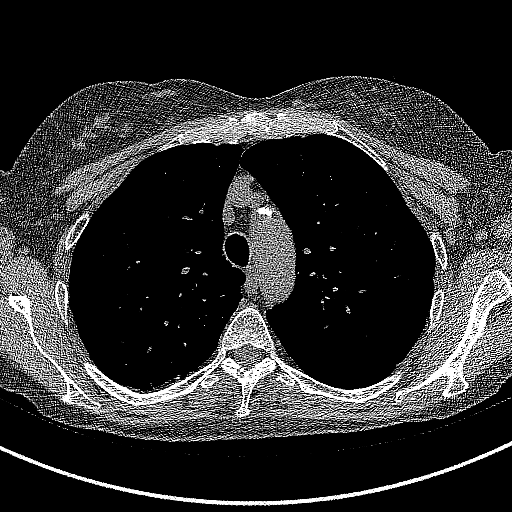
[im 281/369  lung]
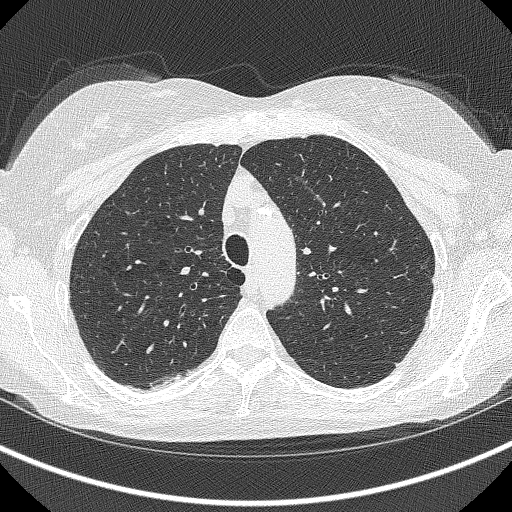
[im 316/369  lung]
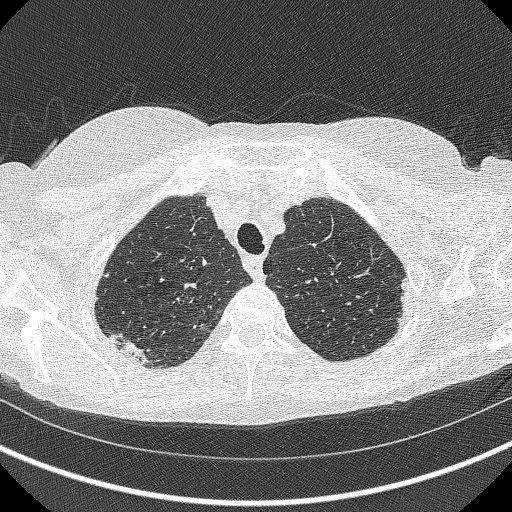
[im 351/369  lung]
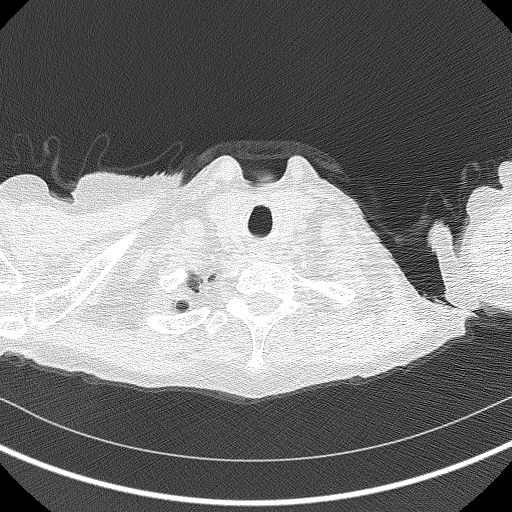

[Series 4: lung · coronal · 0.62mm/px · 3 of 271 slices shown (2 of 2)]
[im 55/271  lung]
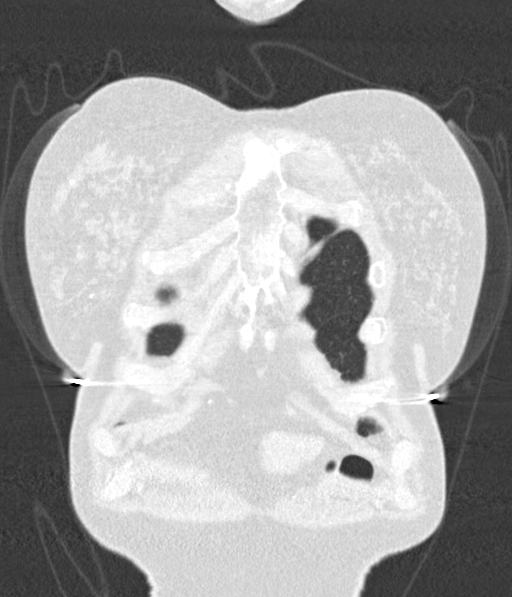
[im 109/271  lung]
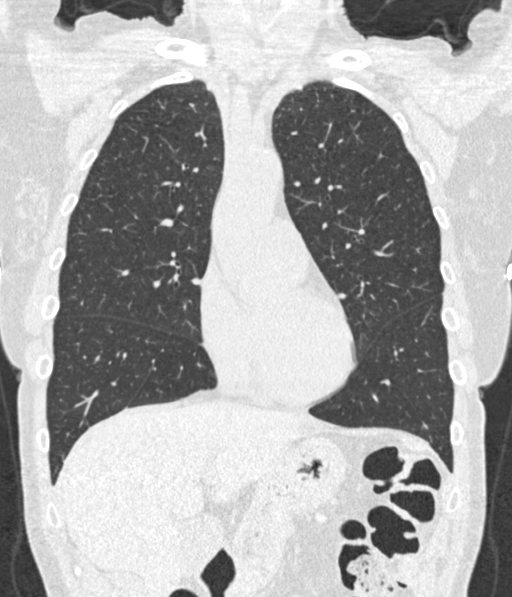
[im 163/271  lung]
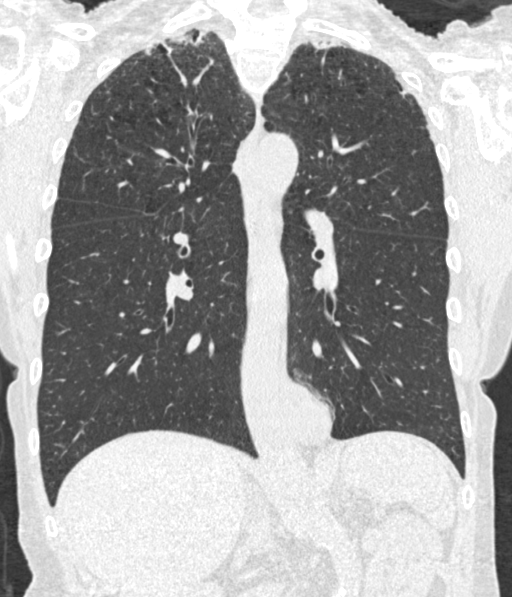

[14 of 40 positions shown; findings below may reference images not displayed]

FINDINGS: Cardiovascular: Heart size is normal. There is no significant
pericardial fluid, thickening or pericardial calcification. There is
aortic atherosclerosis, as well as atherosclerosis of the great
vessels of the mediastinum and the coronary arteries, including
calcified atherosclerotic plaque in the left main, left anterior
descending and right coronary arteries.

Mediastinum/Nodes: No pathologically enlarged mediastinal or hilar
lymph nodes. Please note that accurate exclusion of hilar adenopathy
is limited on noncontrast CT scans. Moderate-sized hiatal hernia. No
axillary lymphadenopathy.

Lungs/Pleura: Several small pulmonary nodules are noted throughout
the lungs bilaterally, largest of which is in the periphery of the
right upper lobe (axial image 53 of series 3), with a volume derived
mean diameter of only 3.0 mm. No larger more suspicious appearing
pulmonary nodules or masses are noted. No acute consolidative
airspace disease. No pleural effusions. Mild diffuse bronchial wall
thickening with mild centrilobular and paraseptal emphysema.
Extensive bilateral apical pleuroparenchymal thickening and
architectural distortion, most compatible with chronic post
infectious or inflammatory scarring.

Upper Abdomen: Low-attenuation lesions in the liver, incompletely
characterized on today's noncontrast CT examination, but
statistically likely to represent cysts, largest of which measures
1.9 cm in segment 2 of the liver, similar to the prior examination.
Aortic atherosclerosis.



Choose 4

## 2020-04-01 DIAGNOSIS — H43813 Vitreous degeneration, bilateral: Secondary | ICD-10-CM | POA: Diagnosis not present

## 2020-04-05 ENCOUNTER — Other Ambulatory Visit: Payer: Self-pay | Admitting: Internal Medicine

## 2020-04-05 DIAGNOSIS — E7800 Pure hypercholesterolemia, unspecified: Secondary | ICD-10-CM | POA: Diagnosis not present

## 2020-04-05 DIAGNOSIS — Z79899 Other long term (current) drug therapy: Secondary | ICD-10-CM | POA: Diagnosis not present

## 2020-04-05 DIAGNOSIS — F5104 Psychophysiologic insomnia: Secondary | ICD-10-CM | POA: Diagnosis not present

## 2020-04-05 DIAGNOSIS — Z1231 Encounter for screening mammogram for malignant neoplasm of breast: Secondary | ICD-10-CM | POA: Diagnosis not present

## 2020-04-05 DIAGNOSIS — I10 Essential (primary) hypertension: Secondary | ICD-10-CM | POA: Diagnosis not present

## 2020-04-05 DIAGNOSIS — E559 Vitamin D deficiency, unspecified: Secondary | ICD-10-CM | POA: Diagnosis not present

## 2020-04-05 DIAGNOSIS — Z0000 Encounter for general adult medical examination without abnormal findings: Secondary | ICD-10-CM | POA: Diagnosis not present

## 2020-04-05 DIAGNOSIS — Z720 Tobacco use: Secondary | ICD-10-CM | POA: Diagnosis not present

## 2020-06-11 DIAGNOSIS — L918 Other hypertrophic disorders of the skin: Secondary | ICD-10-CM | POA: Diagnosis not present

## 2020-06-11 DIAGNOSIS — I781 Nevus, non-neoplastic: Secondary | ICD-10-CM | POA: Diagnosis not present

## 2020-07-22 ENCOUNTER — Other Ambulatory Visit: Payer: Self-pay

## 2020-07-22 ENCOUNTER — Ambulatory Visit
Admission: RE | Admit: 2020-07-22 | Discharge: 2020-07-22 | Disposition: A | Discharge status: Home / Self Care | Payer: PPO | Source: Ambulatory Visit | Attending: Internal Medicine | Admitting: Internal Medicine

## 2020-07-22 DIAGNOSIS — Z1231 Encounter for screening mammogram for malignant neoplasm of breast: Secondary | ICD-10-CM | POA: Diagnosis not present

## 2020-07-29 ENCOUNTER — Other Ambulatory Visit: Payer: Self-pay | Admitting: Internal Medicine

## 2020-07-29 DIAGNOSIS — N6320 Unspecified lump in the left breast, unspecified quadrant: Secondary | ICD-10-CM

## 2020-07-29 DIAGNOSIS — R928 Other abnormal and inconclusive findings on diagnostic imaging of breast: Secondary | ICD-10-CM

## 2020-07-30 ENCOUNTER — Ambulatory Visit
Admission: RE | Admit: 2020-07-30 | Discharge: 2020-07-30 | Disposition: A | Discharge status: Home / Self Care | Payer: PPO | Source: Ambulatory Visit | Attending: Internal Medicine | Admitting: Internal Medicine

## 2020-07-30 ENCOUNTER — Other Ambulatory Visit: Payer: Self-pay

## 2020-07-30 DIAGNOSIS — R928 Other abnormal and inconclusive findings on diagnostic imaging of breast: Secondary | ICD-10-CM | POA: Insufficient documentation

## 2020-07-30 DIAGNOSIS — N6320 Unspecified lump in the left breast, unspecified quadrant: Secondary | ICD-10-CM

## 2020-07-30 DIAGNOSIS — R922 Inconclusive mammogram: Secondary | ICD-10-CM | POA: Diagnosis not present

## 2020-07-31 ENCOUNTER — Other Ambulatory Visit: Payer: Self-pay | Admitting: Internal Medicine

## 2020-07-31 DIAGNOSIS — N6320 Unspecified lump in the left breast, unspecified quadrant: Secondary | ICD-10-CM

## 2020-07-31 DIAGNOSIS — R928 Other abnormal and inconclusive findings on diagnostic imaging of breast: Secondary | ICD-10-CM

## 2020-08-01 ENCOUNTER — Ambulatory Visit
Admission: RE | Admit: 2020-08-01 | Discharge: 2020-08-01 | Disposition: A | Discharge status: Home / Self Care | Payer: PPO | Source: Ambulatory Visit | Attending: Internal Medicine | Admitting: Internal Medicine

## 2020-08-01 ENCOUNTER — Other Ambulatory Visit: Payer: Self-pay

## 2020-08-01 DIAGNOSIS — R928 Other abnormal and inconclusive findings on diagnostic imaging of breast: Secondary | ICD-10-CM

## 2020-08-01 DIAGNOSIS — N6320 Unspecified lump in the left breast, unspecified quadrant: Secondary | ICD-10-CM

## 2020-08-01 DIAGNOSIS — C50412 Malignant neoplasm of upper-outer quadrant of left female breast: Secondary | ICD-10-CM | POA: Diagnosis not present

## 2020-08-01 DIAGNOSIS — Z7689 Persons encountering health services in other specified circumstances: Secondary | ICD-10-CM | POA: Diagnosis not present

## 2020-08-05 ENCOUNTER — Encounter: Payer: Self-pay | Admitting: *Deleted

## 2020-08-05 DIAGNOSIS — C50912 Malignant neoplasm of unspecified site of left female breast: Secondary | ICD-10-CM

## 2020-08-05 NOTE — Progress Notes (Signed)
Notified by Electa Sniff, RN that patient had been notified of her new diagnosis of invasive mammary carcinoma of the left breast, and was ready for navigation.  Called patient to establish navigation services.  States she would like to see Dr. Bary Castilla for surgical consultation and would like to wait until after vacation to see one of the medical oncologist.  Patient is scheduled to see Dr. Grayland Ormond after her vacation on 08/20/20 @ 1:30.  Waiting to confirm appointment date with Dr.Byrnett.  His office is to call me back with a date and time.

## 2020-08-07 DIAGNOSIS — C50412 Malignant neoplasm of upper-outer quadrant of left female breast: Secondary | ICD-10-CM | POA: Diagnosis not present

## 2020-08-08 ENCOUNTER — Other Ambulatory Visit: Payer: Self-pay | Admitting: General Surgery

## 2020-08-08 DIAGNOSIS — C50412 Malignant neoplasm of upper-outer quadrant of left female breast: Secondary | ICD-10-CM

## 2020-08-08 NOTE — Progress Notes (Signed)
Subjective:     Patient ID: Kim Cook is a 70 y.o. female.   HPI   The following portions of the patient's history were reviewed and updated as appropriate.   This a new patient is here today for: office visit. The patient has been referred by Dr. Doy Hutching for evaluation of a newly diagnosed left breast cancer. Patient reports she has been having routine mammograms up until this time. She reports she felt no breast concerns prior to her recent mammogram. Patient states that radiology did recommend a breast MRI.    Patient reports her bra size is 34DD.   The patient is accompanied by her daughter, Raelyn Ensign.         Chief Complaint  Patient presents with   Treatment Plan Discussion      BP 122/72   Pulse 61   Temp 36.4 C (97.5 F)   Ht 165 cm (5' 4.96")   Wt 58.1 kg (128 lb)   SpO2 97%   BMI 21.33 kg/m        Past Medical History:  Diagnosis Date   GERD (gastroesophageal reflux disease)     HTN (hypertension)     Hypercholesterolemia     Kidney stones     Motion sickness     OSTEOPOROSIS     RLS (restless legs syndrome)     Stroke (CMS-HCC)     VITAMIN D DEFICIENCY             Past Surgical History:  Procedure Laterality Date   ASPIRATION CYST BREAST Left      negative   CATARACT EXTRACTION Right 08/30/2019   CATARACT EXTRACTION Left 08/02/2019   [facility], 1978   COLONOSCOPY   09/27/2007, 06/15/2000    Dr. Kennis Carina @ [facility] - Diverticulosis, Int. Hemorrh., rpt 10 yrs per RTE: Colon Sch'ed 09/15/17 @ [place]   COLONOSCOPY   12/06/2017    Hyperplastic Polyp: CBF 11/2027   FOOT SURGERY   2000   HYSTERECTOMY        PARTIAL, 2/2 BLEEDING, NO CANCER                OB History     Gravida  2   Para  2   Term      Preterm      AB      Living         SAB      IAB      Ectopic      Molar      Multiple      Live Births           Obstetric Comments  Age at first period 59 Age of first pregnancy 82 Age of menopause prior to age  45           Social History           Socioeconomic History   Marital status: Married  Tobacco Use   Smoking status: Current Every Day Smoker      Packs/day: 1.00      Years: 40.00      Pack years: 40.00      Types: Cigarettes   Smokeless tobacco: Never Used  Substance and Sexual Activity   Alcohol use: No   Drug use: No  Social History Narrative    SH:    Patient is married and lives with her husband. Has two grown children and grandchildren. Has Bachelor's degree. 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 She has decided to go part time and consider retirement at age 3. Drinks four caffeinated beverages a day. Walks 25-30 minutes three days per week.        No Known Allergies   Current Medications        Current Outpatient Medications  Medication Sig Dispense Refill   *Calcium with D one by mouth twice daily.       alendronate (FOSAMAX) 70 MG tablet TAKE ONE TABLET BY MOUTH EACH WEEK, ON AN EMPTY STOMACH BEFORE BREAKFAST WITH 8oz OF WATER AND REMAIN UPRIGHT FOR :30 12 tablet 3   ascorbic acid (VITAMIN C ORAL) Take by mouth       aspirin 81 mg tablet 1 tab by mouth daily       atorvastatin (LIPITOR) 20 MG tablet Take 1 tablet (20 mg total) by mouth once daily 90 tablet 3   bisoproloL-hydrochlorothiazide (ZIAC) 10-6.25 mg tablet Take 1 tablet by mouth once daily 90 tablet 3   cholecalciferol (VITAMIN D3) 2,000 unit capsule one daily       cyanocobalamin (VITAMIN B12) 1000 MCG tablet Take 1,000 mcg by mouth once daily       melatonin 5 mg Tab Take by mouth once daily       traZODone (DESYREL) 100 MG tablet Take 1 tablet (100 mg total) by mouth nightly 90 tablet 3   ZINC ORAL Take by mouth        No current facility-administered medications for this visit.             Family History  Problem Relation Age of Onset   High blood pressure (Hypertension) Mother     Coronary Artery Disease  (Blocked arteries around heart) Mother     Myocardial Infarction (Heart attack) Mother     High blood pressure (Hypertension) Father     Skin cancer Father     Osteoporosis (Thinning of bones) Maternal Grandmother     Breast cancer Paternal Grandmother     High blood pressure (Hypertension) Son     Breast cancer Maternal Aunt     Breast cancer Cousin            Review of Systems  Constitutional: Negative for chills and fever.  Respiratory: Negative for cough.          Objective:   Physical Exam Exam conducted with a chaperone present.  Constitutional:      Appearance: Normal appearance.  Cardiovascular:     Rate and Rhythm: Normal rate and regular rhythm.     Pulses: Normal pulses.     Heart sounds: Normal heart sounds.  Pulmonary:     Effort: Pulmonary effort is normal.     Breath sounds: Normal breath sounds.  Chest:       Comments: Focal thickening near area of recently completed biopsy consistent with known malignancy.  Markedly ptotic breast bilaterally. Musculoskeletal:     Cervical back: Neck supple.  Lymphadenopathy:     Upper Body:     Right upper body: No supraclavicular or axillary adenopathy.     Left upper body: No supraclavicular or axillary adenopathy.  Skin:    General: Skin is warm and dry.  Neurological:     Mental Status: She is alert and oriented to person, place, and time.  Psychiatric:        Mood and Affect: Mood normal.  Behavior: Behavior normal.        Labs and Radiology:    Ultrasound-guided core biopsy August 01, 2020:   Marland Kitchen LEFT BREAST, UPPER OUTER QUADRANT 1:30, 7CMFN; ULTRASOUND-GUIDED  BIOPSY:  - INVASIVE MAMMARY CARCINOMA, NO SPECIAL TYPE.   Size of invasive carcinoma: 8 mm in this sample  Histologic grade of invasive carcinoma: Grade 1                       Glandular/tubular differentiation score: 2                       Nuclear pleomorphism score: 2                       Mitotic rate score: 1                        Total score: 5  Ductal carcinoma in situ: Present, low-grade  Lymphovascular invasion: Not identified      Mammograms from July 20, 2019 and studies between July 11 and August 01, 2020 were independently reviewed.  New abnormality in the upper outer quadrant of the left breast with no ultrasound correlate.  Biopsy results as noted above.   Screening lung CT dated December 13, 2019: No interval change.   April 05, 2020 laboratory: Component Ref Range & Units 4 mo ago   WBC (White Blood Cell Count) 4.1 - 10.2 10^3/uL 9.4   RBC (Red Blood Cell Count) 4.04 - 5.48 10^6/uL 4.21   Hemoglobin 12.0 - 15.0 gm/dL 13.6   Hematocrit 35.0 - 47.0 % 41.9   MCV (Mean Corpuscular Volume) 80.0 - 100.0 fl 99.5   MCH (Mean Corpuscular Hemoglobin) 27.0 - 31.2 pg 32.3 High    MCHC (Mean Corpuscular Hemoglobin Concentration) 32.0 - 36.0 gm/dL 32.5   Platelet Count 150 - 450 10^3/uL 210   RDW-CV (Red Cell Distribution Width) 11.6 - 14.8 % 13.0   MPV (Mean Platelet Volume) 9.4 - 12.4 fl 9.7   Neutrophils 1.50 - 7.80 10^3/uL 6.61   Lymphocytes 1.00 - 3.60 10^3/uL 1.91   Monocytes 0.00 - 1.50 10^3/uL 0.73   Eosinophils 0.00 - 0.55 10^3/uL 0.08   Basophils 0.00 - 0.09 10^3/uL 0.05   Neutrophil % 32.0 - 70.0 % 70.3 High    Lymphocyte % 10.0 - 50.0 % 20.3   Monocyte % 4.0 - 13.0 % 7.8   Eosinophil % 1.0 - 5.0 % 0.9 Low    Basophil% 0.0 - 2.0 % 0.5   Immature Granulocyte % <=0.7 % 0.2   Immature Granulocyte Count <=0.06 10^3/L 0.02       Glucose 70 - 110 mg/dL 99   Sodium 136 - 145 mmol/L 139   Potassium 3.6 - 5.1 mmol/L 4.0   Chloride 97 - 109 mmol/L 100   Carbon Dioxide (CO2) 22.0 - 32.0 mmol/L 33.6 High    Urea Nitrogen (BUN) 7 - 25 mg/dL 15   Creatinine 0.6 - 1.1 mg/dL 0.7   Glomerular Filtration Rate (eGFR), MDRD Estimate >60 mL/min/1.73sq m 82   Calcium 8.7 - 10.3 mg/dL 9.6   AST  8 - 39 U/L 14   ALT  5 - 38 U/L 10   Alk Phos (alkaline Phosphatase) 34 - 104 U/L 80   Albumin 3.5 - 4.8 g/dL 4.1    Bilirubin, Total 0.3 - 1.2 mg/dL 0.4   Protein, Total 6.1 - 7.9  g/dL 6.7   A/G Ratio 1.0 - 5.0 gm/dL 1.6              Assessment:     Clinical stage I carcinoma of the left breast.   COPD, emphysema based on CT imaging.   Ongoing smoking.    Plan:     Over an hour was spent reviewing options for management,.  Breast conservation and mastectomy were presented is therapeutic equivalents.  The patient is aware that mastectomy would not remove a recommendation for adjuvant chemotherapy if receptor status, especially HER2/neu, or nodal status suggested this would be of benefit.  Role of radiation therapy with lumpectomy was reviewed.   Option for plastic surgery consultation for mastopexy on the right if she desired was reviewed.   The opportunity for second surgical opinion was reviewed.  Declined at this time.   The radiologist did mention the potential for MRI.  This is a well-defined lesion and while she has reasonable breast density the pathology does not suggest the likelihood of a multicentric disease.  The study would be ordered if the patient would feel more comfortable with this.   The patient daughter brought up the role of PET scanning.  We reviewed that this is something reserved for patients with known metastatic disease, and that it is somewhat limited in detecting areas smaller than 8 mm in diameter.   The patient is scheduled to meet with medical oncology on her return from an upcoming vacation at [facility].   Informational brochures were provided as well as appropriate websites.   Patient was encouraged to call should she have questions.  She will notify the office of her decision.    This note is partially prepared by Ledell Noss, CMA acting as a scribe in the presence of Dr. Hervey Ard, MD.    The documentation recorded by the scribe accurately reflects the service I personally performed and the decisions made by me.    Robert Bellow, MD FACS

## 2020-08-13 ENCOUNTER — Encounter
Admission: RE | Admit: 2020-08-13 | Discharge: 2020-08-13 | Disposition: A | Discharge status: Home / Self Care | Payer: PPO | Source: Ambulatory Visit | Attending: General Surgery | Admitting: General Surgery

## 2020-08-13 ENCOUNTER — Other Ambulatory Visit: Payer: Self-pay

## 2020-08-13 NOTE — Patient Instructions (Signed)
Your procedure is scheduled on: 08/21/20 Report to [facility] AT 10:30  To find out your arrival time please call [phone] between 1PM - 3PM on 08/20/20.  Remember: Instructions that are not followed completely may result in serious medical risk, up to and including death, or upon the discretion of your surgeon and anesthesiologist your surgery may need to be rescheduled.     _X__ 1. Do not eat food after midnight the night before your procedure.                 No gum chewing or hard candies. You may drink clear liquids up to 2 hours                 before you are scheduled to arrive for your surgery- DO not drink clear                 liquids within 2 hours of the start of your surgery.                 Clear Liquids include:  water, apple juice without pulp, clear carbohydrate                 drink such as Clearfast or Gatorade, Black Coffee or Tea (Do not add                 anything to coffee or tea). Diabetics water only  __X__2.  On the morning of surgery brush your teeth with toothpaste and water, you                 may rinse your mouth with mouthwash if you wish.  Do not swallow any              toothpaste of mouthwash.     _X__ 3.  No Alcohol for 24 hours before or after surgery.   _X__ 4.  Do Not Smoke or use e-cigarettes For 24 Hours Prior to Your Surgery.                 Do not use any chewable tobacco products for at least 6 hours prior to                 surgery.  ____  5.  Bring all medications with you on the day of surgery if instructed.   __X__  6.  Notify your doctor if there is any change in your medical condition      (cold, fever, infections).     Do not wear jewelry, make-up, hairpins, clips or nail polish. Do not wear lotions, powders, or perfumes.  Do not shave 48 hours prior to surgery. Men may shave face and neck. Do not bring valuables to the hospital.    [facility] is not responsible for any belongings or  valuables.  Contacts, dentures/partials or body piercings may not be worn into surgery. Bring a case for your contacts, glasses or hearing aids, a denture cup will be supplied. Leave your suitcase in the car. After surgery it may be brought to your room. For patients admitted to the hospital, discharge time is determined by your treatment team.   Patients discharged the day of surgery will not be allowed to drive home.   Please read over the following fact sheets that you were given:   CHG SOAP  __X__ Take these medicines the morning of surgery with A SIP OF WATER:  1. NONE  2.   3.   4.  5.  6.  ____ Fleet Enema (as directed)   __X__ Use CHG Soap/SAGE wipes as directed  ____ Use inhalers on the day of surgery  ____ Stop metformin/Janumet/Farxiga 2 days prior to surgery    ____ Take 1/2 of usual insulin dose the night before surgery. No insulin the morning          of surgery.   ____ Stop Blood Thinners Coumadin/Plavix/Xarelto/Pleta/Pradaxa/Eliquis/Effient/Aspirin  on   Or contact your Surgeon, Cardiologist or Medical Doctor regarding  ability to stop your blood thinners  __X__ Stop Anti-inflammatories 7 days before surgery such as Advil, Ibuprofen, Motrin,  BC or Goodies Powder, Naprosyn, Naproxen, Aleve, Aspirin    __X__ Stop all herbal supplements, fish oil or vitamin E until after surgery 7 DAYS PRIOR TO SURGERY.    ____ Bring C-Pap to the hospital.

## 2020-08-14 ENCOUNTER — Encounter: Payer: Self-pay | Admitting: Oncology

## 2020-08-14 LAB — SURGICAL PATHOLOGY

## 2020-08-15 ENCOUNTER — Encounter: Payer: Self-pay | Admitting: *Deleted

## 2020-08-16 ENCOUNTER — Other Ambulatory Visit: Payer: Self-pay

## 2020-08-16 ENCOUNTER — Encounter
Admission: RE | Admit: 2020-08-16 | Discharge: 2020-08-16 | Disposition: A | Discharge status: Home / Self Care | Payer: PPO | Source: Ambulatory Visit | Attending: General Surgery | Admitting: General Surgery

## 2020-08-16 ENCOUNTER — Other Ambulatory Visit: Admission: RE | Admit: 2020-08-16 | Payer: PPO | Source: Ambulatory Visit

## 2020-08-16 DIAGNOSIS — Z01818 Encounter for other preprocedural examination: Secondary | ICD-10-CM | POA: Insufficient documentation

## 2020-08-16 DIAGNOSIS — C50412 Malignant neoplasm of upper-outer quadrant of left female breast: Secondary | ICD-10-CM | POA: Insufficient documentation

## 2020-08-16 NOTE — Progress Notes (Signed)
[place]  [phone]) [phone] [phone]) [phone]  ID: Kim Cook OB: [birth year]  MR#: [id number]  QQI#:[id number]  Patient Care Team: Idelle Crouch, MD as PCP - General (Internal Medicine)  CHIEF COMPLAINT: Clinical stage Ia ER/PR positive, HER2 negative invasive carcinoma of the upper outer quadrant left breast.  INTERVAL HISTORY: Patient is a 70 year old female who underwent routine yearly screening mammogram.  Subsequent ultrasound biopsy revealed the above-stated malignancy.  She currently feels well and is asymptomatic.  She has no neurologic complaints.  She denies any recent fevers or illnesses.  She has a good appetite and denies weight loss.  She has no chest pain, shortness of breath, cough, or hemoptysis.  She denies any nausea, vomiting, constipation, or diarrhea.  She has no urinary complaints.  Patient feels at her baseline and offers no specific complaints today.  REVIEW OF SYSTEMS:   Review of Systems  Constitutional: Negative.  Negative for fever, malaise/fatigue and weight loss.  Respiratory: Negative.  Negative for cough, hemoptysis and shortness of breath.   Cardiovascular: Negative.  Negative for chest pain and leg swelling.  Gastrointestinal: Negative.  Negative for abdominal pain.  Genitourinary: Negative.  Negative for dysuria.  Musculoskeletal: Negative.  Negative for back pain.  Skin: Negative.  Negative for rash.  Neurological: Negative.  Negative for dizziness, focal weakness, weakness and headaches.  Psychiatric/Behavioral: Negative.  The patient is not nervous/anxious.    As per HPI. Otherwise, a complete review of systems is negative.  PAST MEDICAL HISTORY: Past Medical History:  Diagnosis Date   GERD (gastroesophageal reflux disease)    High cholesterol    History of kidney stones    Hypertension    Motion sickness    boats, back seats of cars   Osteoporosis    RLS (restless legs syndrome)    Vitamin D  deficiency     PAST SURGICAL HISTORY: Past Surgical History:  Procedure Laterality Date   ABDOMINAL HYSTERECTOMY     Partial    BREAST CYST ASPIRATION Left    neg   CATARACT EXTRACTION W/PHACO Left 08/02/2019   Procedure: CATARACT EXTRACTION PHACO AND INTRAOCULAR LENS PLACEMENT ([place]) LEFT;  Surgeon: Leandrew Koyanagi, MD;  Location: [place];  Service: Ophthalmology;  Laterality: Left;  5.46 0:56.5 9.7%   CATARACT EXTRACTION W/PHACO Right 08/30/2019   Procedure: CATARACT EXTRACTION PHACO AND INTRAOCULAR LENS PLACEMENT ([place]) RIGHT;  Surgeon: Leandrew Koyanagi, MD;  Location: [place];  Service: Ophthalmology;  Laterality: Right;  5.53 0:51.0 10.8%   COLONOSCOPY     06/15/00; 09/27/07   COLONOSCOPY WITH PROPOFOL N/A 12/06/2017   Procedure: COLONOSCOPY WITH PROPOFOL;  Surgeon: Manya Silvas, MD;  Location: [facility] ENDOSCOPY;  Service: Endoscopy;  Laterality: N/A;   FOOT SURGERY  2000    FAMILY HISTORY: Family History  Problem Relation Age of Onset   Breast cancer Maternal Aunt    Breast cancer Paternal Grandmother    Breast cancer Cousin 68       paternal side   Hypertension Mother    Coronary artery disease Mother    Heart attack Mother    Hypertension Father    Skin cancer Father    Hypertension Son    Osteoporosis Maternal Grandmother     ADVANCED DIRECTIVES (Y/N):  N  HEALTH MAINTENANCE: Social History   Tobacco Use   Smoking status: Every Day    Packs/day: 1.00    Years: 40.00    Pack years: 40.00    Types: Cigarettes  Smokeless tobacco: Never  Vaping Use   Vaping Use: Never used  Substance Use Topics   Alcohol use: No   Drug use: No     Colonoscopy:  PAP:  Bone density:  Lipid panel:  No Known Allergies  Current Outpatient Medications  Medication Sig Dispense Refill   alendronate (FOSAMAX) 70 MG tablet Take 70 mg by mouth every Monday. Take with a full glass of water on an empty stomach.     Ascorbic Acid (VITAMIN C  PO) Take 500 mg by mouth at bedtime.     aspirin 81 MG chewable tablet Chew 81 mg by mouth at bedtime.     atorvastatin (LIPITOR) 20 MG tablet Take 20 mg by mouth at bedtime.     bisoprolol-hydrochlorothiazide (ZIAC) 10-6.25 MG tablet Take 1 tablet by mouth daily.     Cholecalciferol 25 MCG (1000 UT) capsule Take 1,000 Units by mouth at bedtime.     MELATONIN PO Take 5 mg by mouth at bedtime.     Salicylic Acid (WART REMOVER EX) Apply 1 application topically daily as needed (wart removal on arm).     traZODone (DESYREL) 100 MG tablet Take 100 mg by mouth at bedtime.     vitamin B-12 (CYANOCOBALAMIN) 1000 MCG tablet Take 1,000 mcg by mouth at bedtime.     zinc gluconate 50 MG tablet Take 50 mg by mouth at bedtime.     No current facility-administered medications for this visit.    OBJECTIVE: Vitals:   08/20/20 1328  BP: 127/77  Pulse: 60  Resp: 16  Temp: 98.3 F (36.8 C)  SpO2: 99%     Body mass index is 21.46 kg/m.    ECOG FS:0 - Asymptomatic  General: Well-developed, well-nourished, no acute distress. Eyes: Pink conjunctiva, anicteric sclera. HEENT: Normocephalic, moist mucous membranes. Breast: Exam deferred today. Lungs: No audible wheezing or coughing. Heart: Regular rate and rhythm. Abdomen: Soft, nontender, no obvious distention. Musculoskeletal: No edema, cyanosis, or clubbing. Neuro: Alert, answering all questions appropriately. Cranial nerves grossly intact. Skin: No rashes or petechiae noted. Psych: Normal affect. Lymphatics: No cervical, calvicular, axillary or inguinal LAD.   LAB RESULTS:  No results found for: NA, K, CL, CO2, GLUCOSE, BUN, CREATININE, CALCIUM, PROT, ALBUMIN, AST, ALT, ALKPHOS, BILITOT, GFRNONAA, GFRAA  No results found for: WBC, NEUTROABS, HGB, HCT, MCV, PLT   STUDIES: US BREAST LTD UNI LEFT INC AXILLA  Result Date: 07/30/2020 CLINICAL DATA:  70 year old female presenting for recall from screening mammography for a left breast mass.  EXAM: DIGITAL DIAGNOSTIC UNILATERAL LEFT MAMMOGRAM WITH TOMOSYNTHESIS AND CAD; ULTRASOUND LEFT BREAST LIMITED TECHNIQUE: Left digital diagnostic mammography and breast tomosynthesis was performed. The images were evaluated with computer-aided detection.; Targeted ultrasound examination of the left breast was performed COMPARISON:  Previous exam(s). ACR Breast Density Category c: The breast tissue is heterogeneously dense, which may obscure small masses. FINDINGS: Spot compression tomosynthesis images reveal a persistent subcentimeter spiculated mass in the upper-outer quadrant of the left breast. The patient has dense breast tissue and a complex nodular tissue pattern. Ultrasound targeted to the upper-outer quadrant of the left breast demonstrates an irregular hypoechoic mass in the left breast at 1:30, 7 cm from the nipple measuring 6 x 5 x 5 mm. An additional 4 mm mass was imaged at 2 o'clock, 5 cm from the nipple, however upon real-time scanning, the patient does have similar-appearing nodular tissue in the lateral to lower outer quadrant, and this is thought to represent more of this tissue  pattern. Ultrasound of the left axilla demonstrates multiple normal-appearing lymph nodes. IMPRESSION: 1.  There is a suspicious 6 mm mass in the left breast at 1:30. 2.  No evidence of left axillary lymphadenopathy. RECOMMENDATION: 1. Ultrasound-guided biopsy is recommended for the left breast mass at 1:30. If the mass is malignant, breast MRI is suggested to determine extent of disease given the surrounding nodularity and complexity of the breast tissue. We will contact the patient's physician for an order for the biopsy, and that schedule the patient at her earliest convenience. I have discussed the findings and recommendations with the patient. If applicable, a reminder letter will be sent to the patient regarding the next appointment. BI-RADS CATEGORY  5: Highly suggestive of malignancy. Electronically Signed   By:  Ammie Ferrier M.D.   On: 07/30/2020 16:21  MM DIAG BREAST TOMO UNI LEFT  Result Date: 07/30/2020 CLINICAL DATA:  70 year old female presenting for recall from screening mammography for a left breast mass. EXAM: DIGITAL DIAGNOSTIC UNILATERAL LEFT MAMMOGRAM WITH TOMOSYNTHESIS AND CAD; ULTRASOUND LEFT BREAST LIMITED TECHNIQUE: Left digital diagnostic mammography and breast tomosynthesis was performed. The images were evaluated with computer-aided detection.; Targeted ultrasound examination of the left breast was performed COMPARISON:  Previous exam(s). ACR Breast Density Category c: The breast tissue is heterogeneously dense, which may obscure small masses. FINDINGS: Spot compression tomosynthesis images reveal a persistent subcentimeter spiculated mass in the upper-outer quadrant of the left breast. The patient has dense breast tissue and a complex nodular tissue pattern. Ultrasound targeted to the upper-outer quadrant of the left breast demonstrates an irregular hypoechoic mass in the left breast at 1:30, 7 cm from the nipple measuring 6 x 5 x 5 mm. An additional 4 mm mass was imaged at 2 o'clock, 5 cm from the nipple, however upon real-time scanning, the patient does have similar-appearing nodular tissue in the lateral to lower outer quadrant, and this is thought to represent more of this tissue pattern. Ultrasound of the left axilla demonstrates multiple normal-appearing lymph nodes. IMPRESSION: 1.  There is a suspicious 6 mm mass in the left breast at 1:30. 2.  No evidence of left axillary lymphadenopathy. RECOMMENDATION: 1. Ultrasound-guided biopsy is recommended for the left breast mass at 1:30. If the mass is malignant, breast MRI is suggested to determine extent of disease given the surrounding nodularity and complexity of the breast tissue. We will contact the patient's physician for an order for the biopsy, and that schedule the patient at her earliest convenience. I have discussed the findings and  recommendations with the patient. If applicable, a reminder letter will be sent to the patient regarding the next appointment. BI-RADS CATEGORY  5: Highly suggestive of malignancy. Electronically Signed   By: Ammie Ferrier M.D.   On: 07/30/2020 16:21  MM 3D SCREEN BREAST BILATERAL  Result Date: 07/24/2020 CLINICAL DATA:  Screening. EXAM: DIGITAL SCREENING BILATERAL MAMMOGRAM WITH TOMOSYNTHESIS AND CAD TECHNIQUE: Bilateral screening digital craniocaudal and mediolateral oblique mammograms were obtained. Bilateral screening digital breast tomosynthesis was performed. The images were evaluated with computer-aided detection. COMPARISON:  Previous exam(s). ACR Breast Density Category c: The breast tissue is heterogeneously dense, which may obscure small masses. FINDINGS: In the left breast, a possible mass warrants further evaluation. In the right breast, no findings suspicious for malignancy. IMPRESSION: Further evaluation is suggested for a possible mass in the left breast. RECOMMENDATION: Diagnostic mammogram and possibly ultrasound of the left breast. (Code:[id number]) The patient will be contacted regarding the findings, and additional imaging will  be scheduled. BI-RADS CATEGORY  0: Incomplete. Need additional imaging evaluation and/or prior mammograms for comparison. Electronically Signed   By: Ammie Ferrier M.D.   On: 07/24/2020 14:11  MM CLIP PLACEMENT LEFT  Result Date: 08/01/2020 CLINICAL DATA:  Status post ultrasound-guided biopsy EXAM: 3D DIAGNOSTIC LEFT MAMMOGRAM POST ULTRASOUND BIOPSY COMPARISON:  Previous exam(s). FINDINGS: 3D Mammographic images were obtained following ultrasound guided biopsy of a LEFT breast mass. The HEART biopsy marking clip is in expected position at the site of biopsy. It is at the posterior aspect of the mass with associated architectural distortion. IMPRESSION: Appropriate positioning of the HEART shaped biopsy marking clip at the site of biopsy in the upper outer  breast. Final Assessment: Post Procedure Mammograms for Marker Placement Electronically Signed   By: Valentino Saxon MD   On: 08/01/2020 14:05  [place] LT BREAST BX W LOC DEV 1ST LESION IMG BX SPEC US GUIDE  Addendum Date: 08/02/2020   ADDENDUM REPORT: 08/02/2020 14:12 ADDENDUM: PATHOLOGY revealed: A. LEFT BREAST, UPPER OUTER QUADRANT 1:30, 7 CMFN; ULTRASOUND-GUIDED BIOPSY: - INVASIVE MAMMARY CARCINOMA, NO SPECIAL TYPE. Size of invasive carcinoma: 8 mm in this sample. Histologic grade of invasive carcinoma: Grade 1. Ductal carcinoma in situ: Present, low-grade. Lymphovascular invasion: Not identified. Pathology results are CONCORDANT with imaging findings, per Dr. Valentino Saxon. Pathology results and recommendations were discussed with patient via telephone on 08/02/2020. Patient reported doing well after the biopsy with no adverse symptoms, and only slight tenderness at the site. Post biopsy care instructions were reviewed, questions were answered and my direct phone number was provided. Patient was instructed to call [facility] for any additional questions or concerns related to biopsy site. Recommendations: 1. Surgical consultation. Request for surgical consultation relayed to Al Pimple RN and Tanya Nones RN at [facility] by Electa Sniff RN on 08/02/2020. 2. Recommend bilateral breast MRI with and without contrast to determine extent of disease given heterogenous breast density and heterogenous echotexture. Pathology results reported by Electa Sniff RN on 08/02/2020. Electronically Signed   By: Valentino Saxon MD   On: 08/02/2020 14:12   Result Date: 08/02/2020 CLINICAL DATA:  Suspicious LEFT breast mass with architectural distortion EXAM: ULTRASOUND GUIDED LEFT BREAST CORE NEEDLE BIOPSY COMPARISON:  Previous exam(s). PROCEDURE: I met with the patient and we discussed the procedure of ultrasound-guided biopsy, including benefits and alternatives. We discussed the high  likelihood of a successful procedure. We discussed the risks of the procedure, including infection, bleeding, tissue injury, clip migration, and inadequate sampling. Informed written consent was given. The usual time-out protocol was performed immediately prior to the procedure. Lesion quadrant: Upper outer quadrant Using sterile technique and 1% lidocaine and 1% lidocaine with epinephrine as local anesthetic, under direct ultrasound visualization, a 14 gauge spring-loaded device was used to perform biopsy of a LEFT breast mass using a lateral approach. At the conclusion of the procedure a HEART tissue marker clip was deployed into the biopsy cavity. Follow up 2 view mammogram was performed and dictated separately. IMPRESSION: Ultrasound guided biopsy of a LEFT breast mass. No apparent complications. Electronically Signed: By: Valentino Saxon MD On: 08/01/2020 14:17    ASSESSMENT: Clinical stage Ia ER/PR positive, HER2 negative invasive carcinoma of the upper outer quadrant left breast.  PLAN:    Clinical stage Ia ER/PR positive, HER2 negative invasive carcinoma of the upper outer quadrant left breast: Patient has elected for total mastectomy which is scheduled for tomorrow, August 21, 2020.  Because of this,  she will not require adjuvant XRT.  Will send Oncotype DX for completeness to determine whether or not adjuvant chemotherapy is necessary.  Given the ER/PR status of her tumor, she will benefit from an aromatase inhibitor for 5 years.  Return to clinic in 3 weeks, postoperatively, to discuss her final pathology results and additional treatment planning. Osteoporosis: Patient last had a bone mineral density on April 24, 2019 that revealed no significant changes since at least 2019.  Continue Fosamax, calcium, and vitamin D supplementation.  Continue management per primary care.  I spent a total of 60 minutes reviewing chart data, face-to-face evaluation with the patient, counseling and coordination  of care as detailed above.  Patient expressed understanding and was in agreement with this plan. She also understands that She can call clinic at any time with any questions, concerns, or complaints.   Cancer Staging Carcinoma of upper-outer quadrant of female breast, left [facility]) Staging form: Breast, AJCC 8th Edition - Clinical stage from 08/16/2020: Stage IA (cT1b, cN0, cM0, G1, ER+, PR+, HER2-) - Signed by Lloyd Huger, MD on 08/16/2020 Stage prefix: Initial diagnosis Histologic grading system: 3 grade system   Lloyd Huger, MD   08/20/2020 3:04 PM

## 2020-08-20 ENCOUNTER — Encounter: Payer: Self-pay | Admitting: Oncology

## 2020-08-20 ENCOUNTER — Inpatient Hospital Stay: Payer: PPO

## 2020-08-20 ENCOUNTER — Inpatient Hospital Stay: Payer: PPO | Attending: Oncology | Admitting: Oncology

## 2020-08-20 ENCOUNTER — Other Ambulatory Visit: Payer: Self-pay

## 2020-08-20 DIAGNOSIS — M810 Age-related osteoporosis without current pathological fracture: Secondary | ICD-10-CM | POA: Insufficient documentation

## 2020-08-20 DIAGNOSIS — Z170 Estrogen receptor positive status [ER+]: Secondary | ICD-10-CM | POA: Diagnosis not present

## 2020-08-20 DIAGNOSIS — C50412 Malignant neoplasm of upper-outer quadrant of left female breast: Secondary | ICD-10-CM | POA: Insufficient documentation

## 2020-08-21 ENCOUNTER — Other Ambulatory Visit: Payer: Self-pay

## 2020-08-21 ENCOUNTER — Ambulatory Visit: Payer: PPO | Admitting: Anesthesiology

## 2020-08-21 ENCOUNTER — Encounter: Payer: Self-pay | Admitting: General Surgery

## 2020-08-21 ENCOUNTER — Ambulatory Visit
Admission: RE | Admit: 2020-08-21 | Discharge: 2020-08-21 | Disposition: A | Discharge status: Home / Self Care | Payer: PPO | Attending: General Surgery | Admitting: General Surgery

## 2020-08-21 ENCOUNTER — Encounter
Admission: RE | Disposition: A | Discharge status: Home / Self Care | Payer: Self-pay | Source: Home / Self Care | Attending: General Surgery

## 2020-08-21 ENCOUNTER — Encounter
Admission: RE | Admit: 2020-08-21 | Discharge: 2020-08-21 | Disposition: A | Discharge status: Home / Self Care | Payer: PPO | Source: Ambulatory Visit | Attending: General Surgery | Admitting: General Surgery

## 2020-08-21 DIAGNOSIS — F17210 Nicotine dependence, cigarettes, uncomplicated: Secondary | ICD-10-CM | POA: Insufficient documentation

## 2020-08-21 DIAGNOSIS — Z7982 Long term (current) use of aspirin: Secondary | ICD-10-CM | POA: Insufficient documentation

## 2020-08-21 DIAGNOSIS — Z79899 Other long term (current) drug therapy: Secondary | ICD-10-CM | POA: Insufficient documentation

## 2020-08-21 DIAGNOSIS — Z90711 Acquired absence of uterus with remaining cervical stump: Secondary | ICD-10-CM | POA: Insufficient documentation

## 2020-08-21 DIAGNOSIS — E7800 Pure hypercholesterolemia, unspecified: Secondary | ICD-10-CM | POA: Insufficient documentation

## 2020-08-21 DIAGNOSIS — Z7983 Long term (current) use of bisphosphonates: Secondary | ICD-10-CM | POA: Diagnosis not present

## 2020-08-21 DIAGNOSIS — C50412 Malignant neoplasm of upper-outer quadrant of left female breast: Secondary | ICD-10-CM

## 2020-08-21 DIAGNOSIS — I10 Essential (primary) hypertension: Secondary | ICD-10-CM | POA: Insufficient documentation

## 2020-08-21 DIAGNOSIS — Z87442 Personal history of urinary calculi: Secondary | ICD-10-CM | POA: Insufficient documentation

## 2020-08-21 DIAGNOSIS — D360 Benign neoplasm of lymph nodes: Secondary | ICD-10-CM | POA: Diagnosis not present

## 2020-08-21 DIAGNOSIS — C50912 Malignant neoplasm of unspecified site of left female breast: Secondary | ICD-10-CM | POA: Diagnosis not present

## 2020-08-21 HISTORY — PX: SIMPLE MASTECTOMY WITH AXILLARY SENTINEL NODE BIOPSY: SHX6098

## 2020-08-21 SURGERY — SIMPLE MASTECTOMY WITH AXILLARY SENTINEL NODE BIOPSY
Anesthesia: General | Laterality: Left

## 2020-08-21 MED ORDER — CHLORHEXIDINE GLUCONATE CLOTH 2 % EX PADS: 6.0000 | MEDICATED_PAD | Freq: Once | CUTANEOUS | Status: DC

## 2020-08-21 MED ORDER — MEPERIDINE HCL 25 MG/ML IJ SOLN: 6.2500 mg | INTRAMUSCULAR | Status: DC | PRN

## 2020-08-21 MED ORDER — HYDROCODONE-ACETAMINOPHEN 5-325 MG PO TABS: 1.0000 | ORAL_TABLET | ORAL | 0 refills | Status: AC | PRN

## 2020-08-21 MED ORDER — FENTANYL CITRATE (PF) 100 MCG/2ML IJ SOLN
INTRAMUSCULAR | Status: AC
  Administered 2020-08-21: 25 ug via INTRAVENOUS
  Filled 2020-08-21: qty 2

## 2020-08-21 MED ORDER — IPRATROPIUM-ALBUTEROL 0.5-2.5 (3) MG/3ML IN SOLN
RESPIRATORY_TRACT | Status: AC
  Filled 2020-08-21: qty 3

## 2020-08-21 MED ORDER — ONDANSETRON HCL 4 MG/2ML IJ SOLN
INTRAMUSCULAR | Status: DC | PRN
  Administered 2020-08-21: 4 mg via INTRAVENOUS

## 2020-08-21 MED ORDER — PROPOFOL 10 MG/ML IV BOLUS
INTRAVENOUS | Status: DC | PRN
  Administered 2020-08-21: 40 mg via INTRAVENOUS
  Administered 2020-08-21: 80 mg via INTRAVENOUS

## 2020-08-21 MED ORDER — FENTANYL CITRATE (PF) 250 MCG/5ML IJ SOLN
INTRAMUSCULAR | Status: AC
  Filled 2020-08-21: qty 5

## 2020-08-21 MED ORDER — METHYLENE BLUE 0.5 % INJ SOLN
INTRAVENOUS | Status: DC | PRN
  Administered 2020-08-21: 5 mL via SUBMUCOSAL

## 2020-08-21 MED ORDER — FENTANYL CITRATE (PF) 100 MCG/2ML IJ SOLN
25.0000 ug | INTRAMUSCULAR | Status: DC | PRN
  Administered 2020-08-21 (×3): 25 ug via INTRAVENOUS

## 2020-08-21 MED ORDER — MIDAZOLAM HCL 2 MG/2ML IJ SOLN
INTRAMUSCULAR | Status: DC | PRN
  Administered 2020-08-21 (×2): 1 mg via INTRAVENOUS

## 2020-08-21 MED ORDER — OXYCODONE HCL 5 MG PO TABS
ORAL_TABLET | ORAL | Status: AC
  Filled 2020-08-21: qty 1

## 2020-08-21 MED ORDER — MIDAZOLAM HCL 2 MG/2ML IJ SOLN
INTRAMUSCULAR | Status: AC
  Filled 2020-08-21: qty 2

## 2020-08-21 MED ORDER — ACETAMINOPHEN 10 MG/ML IV SOLN
INTRAVENOUS | Status: AC
  Filled 2020-08-21: qty 100

## 2020-08-21 MED ORDER — DEXAMETHASONE SODIUM PHOSPHATE 10 MG/ML IJ SOLN
INTRAMUSCULAR | Status: DC | PRN
  Administered 2020-08-21: 10 mg via INTRAVENOUS

## 2020-08-21 MED ORDER — ORAL CARE MOUTH RINSE: 15.0000 mL | Freq: Once | OROMUCOSAL | Status: AC

## 2020-08-21 MED ORDER — IPRATROPIUM-ALBUTEROL 0.5-2.5 (3) MG/3ML IN SOLN
3.0000 mL | Freq: Once | RESPIRATORY_TRACT | Status: AC
  Administered 2020-08-21: 3 mL via RESPIRATORY_TRACT

## 2020-08-21 MED ORDER — EPHEDRINE SULFATE 50 MG/ML IJ SOLN
INTRAMUSCULAR | Status: DC | PRN
  Administered 2020-08-21 (×3): 5 mg via INTRAVENOUS

## 2020-08-21 MED ORDER — FAMOTIDINE 20 MG PO TABS
ORAL_TABLET | ORAL | Status: AC
  Filled 2020-08-21: qty 1

## 2020-08-21 MED ORDER — TECHNETIUM TC 99M TILMANOCEPT KIT
1.0000 | PACK | Freq: Once | INTRAVENOUS | Status: AC | PRN
  Administered 2020-08-21: 1.048 via INTRADERMAL

## 2020-08-21 MED ORDER — METHYLENE BLUE 0.5 % INJ SOLN
INTRAVENOUS | Status: AC
  Filled 2020-08-21: qty 10

## 2020-08-21 MED ORDER — FENTANYL CITRATE (PF) 100 MCG/2ML IJ SOLN
INTRAMUSCULAR | Status: DC | PRN
  Administered 2020-08-21 (×3): 50 ug via INTRAVENOUS
  Administered 2020-08-21 (×2): 25 ug via INTRAVENOUS

## 2020-08-21 MED ORDER — OXYCODONE HCL 5 MG PO TABS
5.0000 mg | ORAL_TABLET | Freq: Once | ORAL | Status: AC | PRN
Start: 2020-08-21 — End: 2020-08-21
  Administered 2020-08-21: 5 mg via ORAL

## 2020-08-21 MED ORDER — CEFAZOLIN SODIUM-DEXTROSE 2-4 GM/100ML-% IV SOLN
2.0000 g | INTRAVENOUS | Status: AC
  Administered 2020-08-21: 2 g via INTRAVENOUS

## 2020-08-21 MED ORDER — LACTATED RINGERS IV SOLN: INTRAVENOUS | Status: DC

## 2020-08-21 MED ORDER — EPHEDRINE 5 MG/ML INJ
INTRAVENOUS | Status: AC
  Filled 2020-08-21: qty 10

## 2020-08-21 MED ORDER — CHLORHEXIDINE GLUCONATE 0.12 % MT SOLN
OROMUCOSAL | Status: AC
  Administered 2020-08-21: 15 mL via OROMUCOSAL
  Filled 2020-08-21: qty 15

## 2020-08-21 MED ORDER — FENTANYL CITRATE (PF) 100 MCG/2ML IJ SOLN
INTRAMUSCULAR | Status: AC
  Filled 2020-08-21: qty 2

## 2020-08-21 MED ORDER — STERILE WATER FOR IRRIGATION IR SOLN
Status: DC | PRN
  Administered 2020-08-21: 600 mL

## 2020-08-21 MED ORDER — FAMOTIDINE 20 MG PO TABS
20.0000 mg | ORAL_TABLET | Freq: Once | ORAL | Status: AC
  Administered 2020-08-21: 20 mg via ORAL

## 2020-08-21 MED ORDER — GLYCOPYRROLATE 0.2 MG/ML IJ SOLN
INTRAMUSCULAR | Status: DC | PRN
  Administered 2020-08-21: .2 mg via INTRAVENOUS

## 2020-08-21 MED ORDER — OXYCODONE HCL 5 MG/5ML PO SOLN: 5.0000 mg | Freq: Once | ORAL | Status: AC | PRN

## 2020-08-21 MED ORDER — CEFAZOLIN SODIUM-DEXTROSE 2-4 GM/100ML-% IV SOLN
INTRAVENOUS | Status: AC
  Filled 2020-08-21: qty 100

## 2020-08-21 MED ORDER — ONDANSETRON HCL 4 MG/2ML IJ SOLN: 4.0000 mg | Freq: Once | INTRAMUSCULAR | Status: DC | PRN

## 2020-08-21 MED ORDER — LIDOCAINE HCL (CARDIAC) PF 100 MG/5ML IV SOSY
PREFILLED_SYRINGE | INTRAVENOUS | Status: DC | PRN
  Administered 2020-08-21: 60 mg via INTRAVENOUS

## 2020-08-21 MED ORDER — ACETAMINOPHEN 10 MG/ML IV SOLN
INTRAVENOUS | Status: DC | PRN
  Administered 2020-08-21: 1000 mg via INTRAVENOUS

## 2020-08-21 MED ORDER — CHLORHEXIDINE GLUCONATE 0.12 % MT SOLN
15.0000 mL | Freq: Once | OROMUCOSAL | Status: AC
Start: 2020-08-21 — End: 2020-08-21

## 2020-08-21 SURGICAL SUPPLY — 46 items
APL PRP STRL LF DISP 70% ISPRP (MISCELLANEOUS) ×1
BINDER BREAST MEDIUM (GAUZE/BANDAGES/DRESSINGS) ×2 IMPLANT
BLADE PHOTON ILLUMINATED (MISCELLANEOUS) ×2 IMPLANT
BLADE SURG 15 STRL SS SAFETY (BLADE) ×2 IMPLANT
BULB RESERV EVAC DRAIN JP 100C (MISCELLANEOUS) ×2 IMPLANT
CANISTER SUCT 1200ML W/VALVE (MISCELLANEOUS) ×2 IMPLANT
CHLORAPREP W/TINT 26 (MISCELLANEOUS) ×2 IMPLANT
CNTNR SPEC 2.5X3XGRAD LEK (MISCELLANEOUS) ×3
CONT SPEC 4OZ STER OR WHT (MISCELLANEOUS) ×3
CONT SPEC 4OZ STRL OR WHT (MISCELLANEOUS) ×3
CONTAINER SPEC 2.5X3XGRAD LEK (MISCELLANEOUS) ×3 IMPLANT
DRAIN CHANNEL JP 15F RND 16 (MISCELLANEOUS) ×2 IMPLANT
DRAPE LAPAROTOMY TRNSV 106X77 (MISCELLANEOUS) ×2 IMPLANT
DRSG GAUZE FLUFF 36X18 (GAUZE/BANDAGES/DRESSINGS) ×2 IMPLANT
DRSG TELFA 3X8 NADH (GAUZE/BANDAGES/DRESSINGS) ×2 IMPLANT
ELECT CAUTERY BLADE TIP 2.5 (TIP) ×2
ELECT REM PT RETURN 9FT ADLT (ELECTROSURGICAL) ×2
ELECTRODE CAUTERY BLDE TIP 2.5 (TIP) ×1 IMPLANT
ELECTRODE REM PT RTRN 9FT ADLT (ELECTROSURGICAL) ×1 IMPLANT
GAUZE 4X4 16PLY ~~LOC~~+RFID DBL (SPONGE) ×2 IMPLANT
GLOVE SURG ENC MOIS LTX SZ7.5 (GLOVE) ×8 IMPLANT
GLOVE SURG UNDER LTX SZ8 (GLOVE) ×8 IMPLANT
GOWN STRL REUS W/ TWL LRG LVL3 (GOWN DISPOSABLE) ×2 IMPLANT
GOWN STRL REUS W/TWL LRG LVL3 (GOWN DISPOSABLE) ×4
LABEL OR SOLS (LABEL) ×2 IMPLANT
MANIFOLD NEPTUNE II (INSTRUMENTS) ×2 IMPLANT
PACK BASIN MINOR ARMC (MISCELLANEOUS) ×2 IMPLANT
PIN SAFETY STRL (MISCELLANEOUS) ×2 IMPLANT
RETRACTOR RING XSMALL (MISCELLANEOUS) ×1 IMPLANT
RTRCTR WOUND ALEXIS 13CM XS SH (MISCELLANEOUS) ×2
SLEVE PROBE SENORX GAMMA FIND (MISCELLANEOUS) ×2 IMPLANT
SPONGE T-LAP 18X18 ~~LOC~~+RFID (SPONGE) ×2 IMPLANT
STRIP CLOSURE SKIN 1/2X4 (GAUZE/BANDAGES/DRESSINGS) ×4 IMPLANT
SUT ETHILON 3-0 FS-10 30 BLK (SUTURE) ×2
SUT SILK 2 0 (SUTURE) ×2
SUT SILK 2-0 30XBRD TIE 12 (SUTURE) ×1 IMPLANT
SUT SILK 3 0 (SUTURE) ×2
SUT SILK 3-0 18XBRD TIE 12 (SUTURE) ×1 IMPLANT
SUT VIC AB 2-0 CT1 27 (SUTURE) ×4
SUT VIC AB 2-0 CT1 TAPERPNT 27 (SUTURE) ×2 IMPLANT
SUT VIC AB 3-0 SH 27 (SUTURE) ×2
SUT VIC AB 3-0 SH 27X BRD (SUTURE) ×1 IMPLANT
SUT VICRYL+ 3-0 144IN (SUTURE) ×2 IMPLANT
SUTURE EHLN 3-0 FS-10 30 BLK (SUTURE) ×1 IMPLANT
SWABSTK COMLB BENZOIN TINCTURE (MISCELLANEOUS) ×2 IMPLANT
TAPE TRANSPORE STRL 2 31045 (GAUZE/BANDAGES/DRESSINGS) ×2 IMPLANT

## 2020-08-21 NOTE — H&P (Signed)
RIDHI ALMONTE CE:[id number] [birth year]     HPI:  70 y/o with recently diagnosed left breast cancer. She desired to proceed with mastectomy.   Medications Prior to Admission  Medication Sig Dispense Refill Last Dose   alendronate (FOSAMAX) 70 MG tablet Take 70 mg by mouth every Monday. Take with a full glass of water on an empty stomach.   08/20/2020   Ascorbic Acid (VITAMIN C PO) Take 500 mg by mouth at bedtime.   Past Week   aspirin 81 MG chewable tablet Chew 81 mg by mouth at bedtime.   Past Week   atorvastatin (LIPITOR) 20 MG tablet Take 20 mg by mouth at bedtime.   08/20/2020   bisoprolol-hydrochlorothiazide (ZIAC) 10-6.25 MG tablet Take 1 tablet by mouth daily.   08/20/2020   Cholecalciferol 25 MCG (1000 UT) capsule Take 1,000 Units by mouth at bedtime.   Past Week   MELATONIN PO Take 5 mg by mouth at bedtime.   Past Week   traZODone (DESYREL) 100 MG tablet Take 100 mg by mouth at bedtime.   08/20/2020   vitamin B-12 (CYANOCOBALAMIN) 1000 MCG tablet Take 1,000 mcg by mouth at bedtime.   Past Week   zinc gluconate 50 MG tablet Take 50 mg by mouth at bedtime.   Past Week   Salicylic Acid (WART REMOVER EX) Apply 1 application topically daily as needed (wart removal on arm).   08/18/2020   No Known Allergies Past Medical History:  Diagnosis Date   GERD (gastroesophageal reflux disease)    High cholesterol    History of kidney stones    Hypertension    Motion sickness    boats, back seats of cars   Osteoporosis    RLS (restless legs syndrome)    Vitamin D deficiency    Past Surgical History:  Procedure Laterality Date   ABDOMINAL HYSTERECTOMY     Partial    BREAST CYST ASPIRATION Left    neg   CATARACT EXTRACTION W/PHACO Left 08/02/2019   Procedure: CATARACT EXTRACTION PHACO AND INTRAOCULAR LENS PLACEMENT ([place]) LEFT;  Surgeon: Leandrew Koyanagi, MD;  Location: [place];  Service: Ophthalmology;  Laterality: Left;  5.46 0:56.5 9.7%   CATARACT EXTRACTION W/PHACO Right  08/30/2019   Procedure: CATARACT EXTRACTION PHACO AND INTRAOCULAR LENS PLACEMENT ([place]) RIGHT;  Surgeon: Leandrew Koyanagi, MD;  Location: [place];  Service: Ophthalmology;  Laterality: Right;  5.53 0:51.0 10.8%   COLONOSCOPY     06/15/00; 09/27/07   COLONOSCOPY WITH PROPOFOL N/A 12/06/2017   Procedure: COLONOSCOPY WITH PROPOFOL;  Surgeon: Manya Silvas, MD;  Location: [facility] ENDOSCOPY;  Service: Endoscopy;  Laterality: N/A;   FOOT SURGERY  2000   Social History   Socioeconomic History   Marital status: Married    Spouse name: Not on file   Number of children: Not on file   Years of education: Not on file   Highest education level: Not on file  Occupational History   Not on file  Tobacco Use   Smoking status: Every Day    Packs/day: 1.00    Years: 40.00    Pack years: 40.00    Types: Cigarettes   Smokeless tobacco: Never  Vaping Use   Vaping Use: Never used  Substance and Sexual Activity   Alcohol use: No   Drug use: No   Sexual activity: Not on file  Other Topics Concern   Not on file  Social History Narrative   Not on file   Social Determinants  of Health   Financial Resource Strain: Not on file  Food Insecurity: Not on file  Transportation Needs: Not on file  Physical Activity: Not on file  Stress: Not on file  Social Connections: Not on file  Intimate Partner Violence: Not on file   Social History   Social History Narrative   Not on file     ROS: Negative.     PE: HEENT: Negative. Lungs: Clear. Cardio: RR.  Assessment/Plan:  Proceed with planned left mastectomy and SLN biopsy.  Forest Gle[facility] 08/21/2020

## 2020-08-21 NOTE — Transfer of Care (Signed)
Immediate Anesthesia Transfer of Care Note  Patient: Kim Cook  Procedure(s) Performed: SIMPLE MASTECTOMY WITH AXILLARY SENTINEL NODE BIOPSY (Left)  Patient Location: PACU  Anesthesia Type:General  Level of Consciousness: drowsy  Airway & Oxygen Therapy: Patient Spontanous Breathing and Patient connected to face mask oxygen  Post-op Assessment: Report given to RN  Post vital signs: stable  Last Vitals:  Vitals Value Taken Time  BP    Temp    Pulse 93 08/21/20 1533  Resp 16 08/21/20 1533  SpO2 94 % 08/21/20 1533  Vitals shown include unvalidated device data.  Last Pain:  Vitals:   08/21/20 1142  TempSrc: Temporal  PainSc: 0-No pain         Complications: No notable events documented.

## 2020-08-21 NOTE — Anesthesia Preprocedure Evaluation (Addendum)
Anesthesia Evaluation  Patient identified by MRN, date of birth, ID band Patient awake    Reviewed: Allergy & Precautions, NPO status , Patient's Chart, lab work & pertinent test results  Airway Mallampati: I  TM Distance: >3 FB Neck ROM: Full    Dental no notable dental hx. (+) Teeth Intact   Pulmonary Current SmokerPatient did not abstain from smoking.,    Pulmonary exam normal        Cardiovascular Exercise Tolerance: Good hypertension, Pt. on medications Normal cardiovascular examI     Neuro/Psych    GI/Hepatic GERD  Controlled,  Endo/Other    Renal/GU      Musculoskeletal   Abdominal Normal abdominal exam  (+)   Peds  Hematology   Anesthesia Other Findings   Reproductive/Obstetrics                            Anesthesia Physical Anesthesia Plan  ASA: 2  Anesthesia Plan: General   Post-op Pain Management:    Induction: Intravenous  PONV Risk Score and Plan: 4 or greater and Ondansetron, Metaclopromide and Dexamethasone  Airway Management Planned: Oral ETT  Additional Equipment:   Intra-op Plan:   Post-operative Plan: Extubation in OR  Informed Consent: I have reviewed the patients History and Physical, chart, labs and discussed the procedure including the risks, benefits and alternatives for the proposed anesthesia with the patient or authorized representative who has indicated his/her understanding and acceptance.       Plan Discussed with: CRNA  Anesthesia Plan Comments:        Anesthesia Quick Evaluation

## 2020-08-21 NOTE — Discharge Instructions (Signed)

## 2020-08-21 NOTE — Op Note (Signed)
Preoperative diagnosis: Left breast cancer, desire for mastectomy.  Postoperative diagnosis: Same.  Operative procedure: Left simple mastectomy with sentinel node biopsy.  Operating Surgeon: Hervey Ard, MD.  Anesthesia: General by LMA.  Estimated blood loss: Less than 30 cc.  Clinical note: This 70 year old woman was in identified with an invasive mammary carcinoma.  Given her options for management she desired to proceed to mastectomy.  She was injected with technetium sulfur colloid this morning.  She received Ancef prior to the procedure.  SCD stockings for DVT prevention.  Operative note: With the patient under adequate general anesthesia the periareolar skin was cleaned with alcohol and a total of 5 cc of 0.5% methylene blue injected in the subareolar plexus.  The left breast neck and chest was cleansed with ChloraPrep and draped.  An elliptical incision was outlined.  The skin was incised sharply and the remaining dissection completed with the photon blade.  Flaps were elevated to the sternum medially, to about 2 fingerbreadths below the clavicle superiorly where the breast parenchyma petered out, laterally to the serratus muscle and inferior to the rectus fascia.  The breast was elevated off the underlying pectoralis muscle taking the fascia with the specimen.  The axillary envelope was opened and the first hot node which was deep blue in color had counts of 7000.  2 additional nodes with counts of [phone] were identified.  Scanning through the axilla showed no additional nodal tissue of interest.  Hemostasis was with electrocautery and 3-0 Vicryl ties.  The intercostal vessels were controlled with 3-0 Vicryl ties.  The breast was then resected, orientated and sent to pathology fresh for pathology protocol.  A 15 [place] Blake drain was brought through a separate stab wound incision in the lower flap and anchored into place with a 3-0 nylon suture.  The skin flaps were closed with a  running 2-0 Vicryl deep dermal suture in 2 segments.  The skin was approximated with benzoin and Steri-Strips followed by Telfa, fluff gauze and a compressive wrap.  The drain was placed to self suction.  The patient tolerated the procedure well was taken the PACU in stable condition.

## 2020-08-21 NOTE — Anesthesia Procedure Notes (Signed)
Procedure Name: LMA Insertion Date/Time: 08/21/2020 2:10 PM Performed by: Leander Rams, CRNA Pre-anesthesia Checklist: Patient identified, Emergency Drugs available, Suction available and Patient being monitored Patient Re-evaluated:Patient Re-evaluated prior to induction Oxygen Delivery Method: Circle system utilized Preoxygenation: Pre-oxygenation with 100% oxygen Induction Type: IV induction LMA: LMA inserted LMA Size: 3.0 Tube type: Oral Number of attempts: 1 Placement Confirmation: positive ETCO2 and CO2 detector Tube secured with: Tape Dental Injury: Teeth and Oropharynx as per pre-operative assessment

## 2020-08-22 ENCOUNTER — Encounter: Payer: Self-pay | Admitting: General Surgery

## 2020-08-22 NOTE — Anesthesia Postprocedure Evaluation (Signed)
Anesthesia Post Note  Patient: Kim Cook  Procedure(s) Performed: SIMPLE MASTECTOMY WITH AXILLARY SENTINEL NODE BIOPSY (Left)  Patient location during evaluation: PACU Anesthesia Type: General Level of consciousness: awake and alert Pain management: pain level controlled Vital Signs Assessment: post-procedure vital signs reviewed and stable Respiratory status: spontaneous breathing, nonlabored ventilation, respiratory function stable and patient connected to nasal cannula oxygen Cardiovascular status: blood pressure returned to baseline and stable Postop Assessment: no apparent nausea or vomiting Anesthetic complications: no   No notable events documented.   Last Vitals:  Vitals:   08/21/20 1630 08/21/20 1641  BP: (!) 138/50 (!) 149/82  Pulse: 78 76  Resp: 19 18  Temp: 36.7 C (!) 36.1 C  SpO2: 95%     Last Pain:  Vitals:   08/21/20 1641  TempSrc: Temporal  PainSc:                  Lennox Pippins

## 2020-08-23 ENCOUNTER — Other Ambulatory Visit: Payer: Self-pay | Admitting: Pathology

## 2020-08-23 LAB — SURGICAL PATHOLOGY

## 2020-08-26 ENCOUNTER — Telehealth: Payer: Self-pay

## 2020-08-26 NOTE — Telephone Encounter (Signed)
[employer] order submitted online (order [id number]).  Supporting documentation faxed to [employer] customer services as well as order faxed [facility] pathology.

## 2020-09-05 ENCOUNTER — Institutional Professional Consult (permissible substitution): Payer: PPO | Admitting: Radiation Oncology

## 2020-09-05 ENCOUNTER — Ambulatory Visit: Payer: PPO | Admitting: Oncology

## 2020-09-05 NOTE — Progress Notes (Signed)
[place]  [phone] [phone]) [phone]  ID: Kim Cook OB: 04/06/1950  MR#: [id number]  HQI#:[id number]  Patient Care Team: Idelle Crouch, MD as PCP - General (Internal Medicine)  CHIEF COMPLAINT: Clinical stage Ia ER/PR positive, HER2 negative invasive carcinoma of the upper outer quadrant left breast.  Oncotype DX score 9, low risk.  INTERVAL HISTORY: Patient returns to clinic today for further evaluation and treatment she is now fully recovered from her surgery.  She currently feels well and is asymptomatic. She has no neurologic complaints.  She denies any recent fevers or illnesses.  She has a good appetite and denies weight loss.  She has no chest pain, shortness of breath, cough, or hemoptysis.  She denies any nausea, vomiting, constipation, or diarrhea.  She has no urinary complaints.  Patient offers no specific complaints today.  REVIEW OF SYSTEMS:   Review of Systems  Constitutional: Negative.  Negative for fever, malaise/fatigue and weight loss.  Respiratory: Negative.  Negative for cough, hemoptysis and shortness of breath.   Cardiovascular: Negative.  Negative for chest pain and leg swelling.  Gastrointestinal: Negative.  Negative for abdominal pain.  Genitourinary: Negative.  Negative for dysuria.  Musculoskeletal: Negative.  Negative for back pain.  Skin: Negative.  Negative for rash.  Neurological: Negative.  Negative for dizziness, focal weakness, weakness and headaches.  Psychiatric/Behavioral: Negative.  The patient is not nervous/anxious.    As per HPI. Otherwise, a complete review of systems is negative.  PAST MEDICAL HISTORY: Past Medical History:  Diagnosis Date   GERD (gastroesophageal reflux disease)    High cholesterol    History of kidney stones    Hypertension    Motion sickness    boats, back seats of cars   Osteoporosis    RLS (restless legs syndrome)    Vitamin D deficiency     PAST SURGICAL  HISTORY: Past Surgical History:  Procedure Laterality Date   ABDOMINAL HYSTERECTOMY     Partial    BREAST CYST ASPIRATION Left    neg   CATARACT EXTRACTION W/PHACO Left 08/02/2019   Procedure: CATARACT EXTRACTION PHACO AND INTRAOCULAR LENS PLACEMENT ([place]) LEFT;  Surgeon: Leandrew Koyanagi, MD;  Location: [place];  Service: Ophthalmology;  Laterality: Left;  5.46 0:56.5 9.7%   CATARACT EXTRACTION W/PHACO Right 08/30/2019   Procedure: CATARACT EXTRACTION PHACO AND INTRAOCULAR LENS PLACEMENT ([place]) RIGHT;  Surgeon: Leandrew Koyanagi, MD;  Location: [place];  Service: Ophthalmology;  Laterality: Right;  5.53 0:51.0 10.8%   COLONOSCOPY     06/15/00; 09/27/07   COLONOSCOPY WITH PROPOFOL N/A 12/06/2017   Procedure: COLONOSCOPY WITH PROPOFOL;  Surgeon: Manya Silvas, MD;  Location: [facility] ENDOSCOPY;  Service: Endoscopy;  Laterality: N/A;   FOOT SURGERY  2000   SIMPLE MASTECTOMY WITH AXILLARY SENTINEL NODE BIOPSY Left 08/21/2020   Procedure: SIMPLE MASTECTOMY WITH AXILLARY SENTINEL NODE BIOPSY;  Surgeon: Robert Bellow, MD;  Location: [facility] ORS;  Service: General;  Laterality: Left;    FAMILY HISTORY: Family History  Problem Relation Age of Onset   Breast cancer Maternal Aunt    Breast cancer Paternal Grandmother    Breast cancer Cousin 70       paternal side   Hypertension Mother    Coronary artery disease Mother    Heart attack Mother    Hypertension Father    Skin cancer Father    Hypertension Son    Osteoporosis Maternal Grandmother     ADVANCED DIRECTIVES (Y/N):  N  HEALTH MAINTENANCE: Social History   Tobacco Use   Smoking status: Every Day    Packs/day: 1.00    Years: 40.00    Pack years: 40.00    Types: Cigarettes   Smokeless tobacco: Never  Vaping Use   Vaping Use: Never used  Substance Use Topics   Alcohol use: No   Drug use: No     Colonoscopy:  PAP:  Bone density:  Lipid panel:  No Known Allergies  Current Outpatient  Medications  Medication Sig Dispense Refill   alendronate (FOSAMAX) 70 MG tablet Take 70 mg by mouth every Monday. Take with a full glass of water on an empty stomach.     Ascorbic Acid (VITAMIN C PO) Take 500 mg by mouth at bedtime.     aspirin 81 MG chewable tablet Chew 81 mg by mouth at bedtime.     atorvastatin (LIPITOR) 20 MG tablet Take 20 mg by mouth at bedtime.     bisoprolol-hydrochlorothiazide (ZIAC) 10-6.25 MG tablet Take 1 tablet by mouth daily.     Cholecalciferol 25 MCG (1000 UT) capsule Take 1,000 Units by mouth at bedtime.     HYDROcodone-acetaminophen (NORCO/VICODIN) 5-325 MG tablet Take 1 tablet by mouth every 4 (four) hours as needed for moderate pain. 10 tablet 0   letrozole (FEMARA) 2.5 MG tablet Take 1 tablet (2.5 mg total) by mouth daily. 90 tablet 3   MELATONIN PO Take 5 mg by mouth at bedtime.     Salicylic Acid (WART REMOVER EX) Apply 1 application topically daily as needed (wart removal on arm).     traZODone (DESYREL) 100 MG tablet Take 100 mg by mouth at bedtime.     vitamin B-12 (CYANOCOBALAMIN) 1000 MCG tablet Take 1,000 mcg by mouth at bedtime.     zinc gluconate 50 MG tablet Take 50 mg by mouth at bedtime.     No current facility-administered medications for this visit.    OBJECTIVE: Vitals:   09/10/20 1054  BP: 131/64  Pulse: (!) 53  Resp: 16  Temp: (!) 97 F (36.1 C)  SpO2: 98%     Body mass index is 21.6 kg/m.    ECOG FS:0 - Asymptomatic  General: Well-developed, well-nourished, no acute distress. Eyes: Pink conjunctiva, anicteric sclera. HEENT: Normocephalic, moist mucous membranes. Breast: Left mastectomy. Lungs: No audible wheezing or coughing. Heart: Regular rate and rhythm. Abdomen: Soft, nontender, no obvious distention. Musculoskeletal: No edema, cyanosis, or clubbing. Neuro: Alert, answering all questions appropriately. Cranial nerves grossly intact. Skin: No rashes or petechiae noted. Psych: Normal affect.   LAB RESULTS:  No  results found for: NA, K, CL, CO2, GLUCOSE, BUN, CREATININE, CALCIUM, PROT, ALBUMIN, AST, ALT, ALKPHOS, BILITOT, GFRNONAA, GFRAA  No results found for: WBC, NEUTROABS, HGB, HCT, MCV, PLT   STUDIES: NM Sentinel Node Inj-No Rpt (Breast)  Result Date: 08/21/2020 Sulfur Colloid was injected by the Nuclear Medicine Technologist for sentinel lymph node localization.    ASSESSMENT: Clinical stage Ia ER/PR positive, HER2 negative invasive carcinoma of the upper outer quadrant left breast.  PLAN:    Clinical stage Ia ER/PR positive, HER2 negative invasive carcinoma of the upper outer quadrant left breast: Patient underwent total mastectomy on August 21, 2020.  Because of this, she did not require adjuvant XRT.  Oncotype Dx equal 9 is low risk therefore chemotherapy was not necessary.  Patient was given a prescription for letrozole which she will take for a total of 5 years completing treatment in August 2027.  Return  to clinic in 3 months for routine evaluation. Osteoporosis: Patient last had a bone mineral density on April 24, 2019 that revealed no significant changes since at least 2019.  Continue Fosamax, calcium, and vitamin D supplementation.  Repeat bone density in the next 1 to 2 weeks.    I spent a total of 30 minutes reviewing chart data, face-to-face evaluation with the patient, counseling and coordination of care as detailed above.    Patient expressed understanding and was in agreement with this plan. She also understands that She can call clinic at any time with any questions, concerns, or complaints.   Cancer Staging Carcinoma of upper-outer quadrant of female breast, left [facility]) Staging form: Breast, AJCC 8th Edition - Clinical stage from 08/16/2020: Stage IA (cT1b, cN0, cM0, G1, ER+, PR+, HER2-) - Signed by Lloyd Huger, MD on 08/16/2020 Stage prefix: Initial diagnosis Histologic grading system: 3 grade system   Lloyd Huger, MD   09/10/2020 10:00 PM

## 2020-09-06 ENCOUNTER — Encounter: Payer: Self-pay | Admitting: Oncology

## 2020-09-06 DIAGNOSIS — C50412 Malignant neoplasm of upper-outer quadrant of left female breast: Secondary | ICD-10-CM | POA: Diagnosis not present

## 2020-09-06 DIAGNOSIS — Z170 Estrogen receptor positive status [ER+]: Secondary | ICD-10-CM | POA: Diagnosis not present

## 2020-09-10 ENCOUNTER — Inpatient Hospital Stay: Payer: PPO | Admitting: Oncology

## 2020-09-10 ENCOUNTER — Encounter: Payer: Self-pay | Admitting: Oncology

## 2020-09-10 VITALS — BP 131/64 | HR 53 | Temp 97.0°F | Resp 16 | Ht 64.5 in | Wt 127.8 lb

## 2020-09-10 DIAGNOSIS — C50412 Malignant neoplasm of upper-outer quadrant of left female breast: Secondary | ICD-10-CM

## 2020-09-10 MED ORDER — LETROZOLE 2.5 MG PO TABS: 2.5000 mg | ORAL_TABLET | Freq: Every day | ORAL | 3 refills | Status: DC

## 2020-09-10 NOTE — Progress Notes (Signed)
Recent mastectomy on L side

## 2020-09-23 DIAGNOSIS — C44629 Squamous cell carcinoma of skin of left upper limb, including shoulder: Secondary | ICD-10-CM | POA: Diagnosis not present

## 2020-09-23 DIAGNOSIS — D485 Neoplasm of uncertain behavior of skin: Secondary | ICD-10-CM | POA: Diagnosis not present

## 2020-10-02 ENCOUNTER — Inpatient Hospital Stay: Payer: PPO | Attending: Oncology | Admitting: *Deleted

## 2020-10-02 ENCOUNTER — Ambulatory Visit
Admission: RE | Admit: 2020-10-02 | Discharge: 2020-10-02 | Disposition: A | Discharge status: Home / Self Care | Payer: PPO | Source: Ambulatory Visit | Attending: Oncology | Admitting: Oncology

## 2020-10-02 ENCOUNTER — Other Ambulatory Visit: Payer: Self-pay

## 2020-10-02 DIAGNOSIS — M85852 Other specified disorders of bone density and structure, left thigh: Secondary | ICD-10-CM | POA: Diagnosis not present

## 2020-10-02 DIAGNOSIS — C50412 Malignant neoplasm of upper-outer quadrant of left female breast: Secondary | ICD-10-CM | POA: Diagnosis not present

## 2020-10-02 DIAGNOSIS — C50919 Malignant neoplasm of unspecified site of unspecified female breast: Secondary | ICD-10-CM

## 2020-10-02 DIAGNOSIS — M810 Age-related osteoporosis without current pathological fracture: Secondary | ICD-10-CM | POA: Insufficient documentation

## 2020-10-02 DIAGNOSIS — Z780 Asymptomatic menopausal state: Secondary | ICD-10-CM | POA: Insufficient documentation

## 2020-10-02 DIAGNOSIS — Z13820 Encounter for screening for osteoporosis: Secondary | ICD-10-CM | POA: Diagnosis not present

## 2020-10-02 NOTE — Progress Notes (Addendum)
Multidisciplinary Oncology Council Documentation  Kim Cook was presented by our [facility] on 10/02/2020, which included representatives from:  Palliative Care Dietitian  Physical/Occupational Therapist Nurse Navigator Genetics Speech Therapist Survivorship RN Research RN  Brownie currently presents with history of breast cancer.  We reviewed previous medical and familial history, history of present illness, and recent lab results along with all available histopathologic and imaging studies. The [place] considered available treatment options and made the following recommendations/referrals:  -referral to [place] will follow up with patient by phone to assess for rehab needs.  The MOC is a meeting of clinicians from various specialty areas who evaluate and discuss patients for whom a multidisciplinary approach is being considered. Final determinations in the plan of care are those of the provider(s).   Today's extended care, comprehensive team conference, Kim Cook was not present for the discussion and was not examined.

## 2020-10-07 ENCOUNTER — Telehealth: Payer: Self-pay

## 2020-10-07 MED ORDER — TAMOXIFEN CITRATE 20 MG PO TABS: 20.0000 mg | ORAL_TABLET | Freq: Every day | ORAL | 1 refills | Status: DC

## 2020-10-07 NOTE — Telephone Encounter (Signed)
Results have been forwarded to Dr. Doy Hutching, patient notified of results and rx for tamoxifen sent to [facility].

## 2020-10-07 NOTE — Addendum Note (Signed)
Addended by: Telford Nab on: 10/07/2020 01:56 PM   Modules accepted: Orders

## 2020-10-07 NOTE — Telephone Encounter (Signed)
-----   Message from Wilford Corner, [place] sent at 10/07/2020  9:03 AM EDT -----  ----- Message ----- From: Lloyd Huger, MD Sent: 10/07/2020   7:35 AM EDT To: Wilford Corner, RN, Vanice Sarah, CMA  This looks worse than her BMD from 2109.  Please have patient discontinue letrozole and send a script for tamoxifen. Also, please forward to results to Dr. Doy Hutching, it appear he was previously managing her osteoporosis.  Thanks!  ----- Message ----- From: Interface, Rad Results In Sent: 10/02/2020   1:27 PM EDT To: Lloyd Huger, MD

## 2020-10-09 DIAGNOSIS — C44629 Squamous cell carcinoma of skin of left upper limb, including shoulder: Secondary | ICD-10-CM | POA: Diagnosis not present

## 2020-10-17 ENCOUNTER — Encounter: Payer: Self-pay | Admitting: Licensed Clinical Social Worker

## 2020-10-17 ENCOUNTER — Inpatient Hospital Stay: Payer: PPO | Attending: Oncology | Admitting: Licensed Clinical Social Worker

## 2020-10-17 ENCOUNTER — Other Ambulatory Visit: Payer: Self-pay

## 2020-10-17 ENCOUNTER — Inpatient Hospital Stay: Payer: PPO

## 2020-10-17 DIAGNOSIS — Z803 Family history of malignant neoplasm of breast: Secondary | ICD-10-CM | POA: Diagnosis not present

## 2020-10-17 DIAGNOSIS — C50412 Malignant neoplasm of upper-outer quadrant of left female breast: Secondary | ICD-10-CM

## 2020-10-17 DIAGNOSIS — Z801 Family history of malignant neoplasm of trachea, bronchus and lung: Secondary | ICD-10-CM

## 2020-10-17 NOTE — Progress Notes (Signed)
REFERRING PROVIDER: Lloyd Huger, MD [address] [place],  [place] [address]  PRIMARY PROVIDER:  Idelle Crouch, MD  PRIMARY REASON FOR VISIT:  1. Carcinoma of upper-outer quadrant of female breast, left ([place])   2. Family history of breast cancer   3. Family history of lung cancer      HISTORY OF PRESENT ILLNESS:   Kim Cook, a 70 y.o. female, was seen for a [facility] cancer genetics consultation at the request of Dr. Grayland Ormond due to a personal and family history of breast cancer.  Kim Cook presents to clinic today to discuss the possibility of a hereditary predisposition to cancer, genetic testing, and to further clarify her future cancer risks, as well as potential cancer risks for family members.   In 2022, at the age of 44, Kim Cook was diagnosed with left breast cancer, ER/PR+, Her2-. The treatment plan includes mastectomy (completed 08/21/2020) and letrozole.   CANCER HISTORY:  Oncology History  Carcinoma of upper-outer quadrant of female breast, left ([place])  08/16/2020 Initial Diagnosis   Carcinoma of upper-outer quadrant of female breast, left ([place])   08/16/2020 Cancer Staging   Staging form: Breast, AJCC 8th Edition - Clinical stage from 08/16/2020: Stage IA (cT1b, cN0, cM0, G1, ER+, PR+, HER2-) - Signed by Lloyd Huger, MD on 08/16/2020 Stage prefix: Initial diagnosis Histologic grading system: 3 grade system      RISK FACTORS:  Menarche was at age 67-13.  First live birth at age 20.  OCP use for approximately 7 years.  Ovaries intact: yes.  Hysterectomy: yes.  Menopausal status: postmenopausal.  HRT use: 1 years. Colonoscopy: yes;  reports few polyps . Mammogram within the last year: yes. Number of breast biopsies: 1.  Past Medical History:  Diagnosis Date   Family history of breast cancer    Family history of lung cancer    GERD (gastroesophageal reflux disease)    High cholesterol    History of kidney stones    Hypertension     Motion sickness    boats, back seats of cars   Osteoporosis    RLS (restless legs syndrome)    Vitamin D deficiency     Past Surgical History:  Procedure Laterality Date   ABDOMINAL HYSTERECTOMY     Partial    BREAST CYST ASPIRATION Left    neg   CATARACT EXTRACTION W/PHACO Left 08/02/2019   Procedure: CATARACT EXTRACTION PHACO AND INTRAOCULAR LENS PLACEMENT ([place]) LEFT;  Surgeon: Leandrew Koyanagi, MD;  Location: [place];  Service: Ophthalmology;  Laterality: Left;  5.46 0:56.5 9.7%   CATARACT EXTRACTION W/PHACO Right 08/30/2019   Procedure: CATARACT EXTRACTION PHACO AND INTRAOCULAR LENS PLACEMENT ([place]) RIGHT;  Surgeon: Leandrew Koyanagi, MD;  Location: [place];  Service: Ophthalmology;  Laterality: Right;  5.53 0:51.0 10.8%   COLONOSCOPY     06/15/00; 09/27/07   COLONOSCOPY WITH PROPOFOL N/A 12/06/2017   Procedure: COLONOSCOPY WITH PROPOFOL;  Surgeon: Manya Silvas, MD;  Location: [facility] ENDOSCOPY;  Service: Endoscopy;  Laterality: N/A;   FOOT SURGERY  2000   SIMPLE MASTECTOMY WITH AXILLARY SENTINEL NODE BIOPSY Left 08/21/2020   Procedure: SIMPLE MASTECTOMY WITH AXILLARY SENTINEL NODE BIOPSY;  Surgeon: Robert Bellow, MD;  Location: [facility] ORS;  Service: General;  Laterality: Left;    Social History   Socioeconomic History   Marital status: Married    Spouse name: Not on file   Number of children: Not on file   Years of education: Not  on file   Highest education level: Not on file  Occupational History   Not on file  Tobacco Use   Smoking status: Every Day    Packs/day: 1.00    Years: 40.00    Pack years: 40.00    Types: Cigarettes   Smokeless tobacco: Never  Vaping Use   Vaping Use: Never used  Substance and Sexual Activity   Alcohol use: No   Drug use: No   Sexual activity: Not on file  Other Topics Concern   Not on file  Social History Narrative   Not on file   Social Determinants of Health   Financial Resource Strain: Not  on file  Food Insecurity: Not on file  Transportation Needs: Not on file  Physical Activity: Not on file  Stress: Not on file  Social Connections: Not on file     FAMILY HISTORY:  We obtained a detailed, 4-generation family history.  Significant diagnoses are listed below: Family History  Problem Relation Age of Onset   Hypertension Mother    Coronary artery disease Mother    Heart attack Mother    Hypertension Father    Skin cancer Father    Breast cancer Maternal Aunt        dx late 54s   Lung cancer Maternal Uncle    Breast cancer Paternal Aunt        dx 97s-50s   Osteoporosis Maternal Grandmother    Breast cancer Paternal Grandmother        dx 20s-30s   Hypertension Son    Breast cancer Cousin 64       paternal side   Kim Cook has 1 son and 1 daughter, 70 and 68, no cancers. She has 1 sister, 83, no history of cancer besides skin cancer.   Kim Cook mother died at 56 of a heart attack. Patient had 2 maternal aunts, 1 uncle. An aunt had breast cancer in her late 28s, an uncle had lung cancer in his 75s. Maternal grandparents passed in their 26s.   Kim Cook father died at 83. Patient had 2 paternal aunts, 1 uncle. One aunt had breast cancer in her late 52s and died in her late 77s-50s. A paternal cousin had breast in her 15s. Paternal grandmother had breast cancer in her 42s-30s and died from it. Paternal grandfather died in his 26s.   Kim Cook is unaware of previous family history of genetic testing for hereditary cancer risks. Patient's maternal ancestors are of unknown descent, and paternal ancestors are of unknown descent. There is no reported Ashkenazi Jewish ancestry. There is no known consanguinity.    GENETIC COUNSELING ASSESSMENT: Kim Cook is a 70 y.o. female with a personal and family history of breast cancer which is somewhat suggestive of a hereditary cancer syndrome and predisposition to cancer. We, therefore, discussed and recommended the  following at today's visit.   DISCUSSION: We discussed that approximately 5-10% of breast cancer is hereditary. Most cases of hereditary breast cancer are associated with BRCA1/BRCA2 genes, although there are other genes associated with hereditary breast cancer as well. Cancers and risks are gene specific.  We discussed that testing is beneficial for several reasons including knowing about other cancer risks, identifying potential screening and risk-reduction options that may be appropriate, and to understand if other family members could be at risk for cancer and allow them to undergo genetic testing.   We reviewed the characteristics, features and inheritance patterns of hereditary cancer syndromes. We also discussed  genetic testing, including the appropriate family members to test, the process of testing, insurance coverage and turn-around-time for results. We discussed the implications of a negative, positive and/or variant of uncertain significant result. We recommended Kim Cook pursue genetic testing for the [facility] Multi-Cancer+RNA gene panel.   The Multi-Cancer Panel + RNA offered by [facility] includes sequencing and/or deletion duplication testing of the following 84 genes: AIP, ALK, APC, ATM, AXIN2,BAP1,  BARD1, BLM, BMPR1A, BRCA1, BRCA2, BRIP1, CASR, CDC73, CDH1, CDK4, CDKN1B, CDKN1C, CDKN2A (p14ARF), CDKN2A (p16INK4a), CEBPA, CHEK2, CTNNA1, DICER1, DIS3L2, EGFR (c.2369C>T, p.Thr790Met variant only), EPCAM (Deletion/duplication testing only), FH, FLCN, GATA2, GPC3, GREM1 (Promoter region deletion/duplication testing only), HOXB13 (c.251G>A, p.Gly84Glu), HRAS, KIT, MAX, MEN1, MET, MITF (c.952G>A, p.Glu318Lys variant only), MLH1, MSH2, MSH3, MSH6, MUTYH, NBN, NF1, NF2, NTHL1, PALB2, PDGFRA, PHOX2B, PMS2, POLD1, POLE, POT1, PRKAR1A, PTCH1, PTEN, RAD50, RAD51C, RAD51D, RB1, RECQL4, RET, RUNX1, SDHAF2, SDHA (sequence changes only), SDHB, SDHC, SDHD, SMAD4, SMARCA4, SMARCB1, SMARCE1, STK11, SUFU, TERC,  TERT, TMEM127, TP53, TSC1, TSC2, VHL, WRN and WT1.  Based on Kim Cook's personal and family history of cancer, she meets medical criteria for genetic testing. Despite that she meets criteria, she may still have an out of pocket cost. We discussed that if her out of pocket cost for testing is over $100, the laboratory will call and confirm whether she wants to proceed with testing.  If the out of pocket cost of testing is less than $100 she will be billed by the genetic testing laboratory.   PLAN: After considering the risks, benefits, and limitations, Kim Cook provided informed consent to pursue genetic testing and the blood sample was sent to [facility] for analysis of the Multi-Cancer+RNA Panel. Results should be available within approximately 2-3 weeks' time, at which point they will be disclosed by telephone to Kim Cook, as will any additional recommendations warranted by these results. Kim Cook will receive a summary of her genetic counseling visit and a copy of her results once available. This information will also be available in Epic.   Kim Cook questions were answered to her satisfaction today. Our contact information was provided should additional questions or concerns arise. Thank you for the referral and allowing [place] to share in the care of your patient.   Faith Rogue, MS, [facility] Genetic Counselor [email or web address] Phone: [phone]  The patient was seen for a total of 30 minutes in face-to-face genetic counseling.  Patient was seen alone. Dr. Grayland Ormond was available for discussion regarding this case.   _______________________________________________________________________ For Office Staff:  Number of people involved in session: 1 Was an Intern/ student involved with case: no

## 2020-11-04 ENCOUNTER — Telehealth: Payer: Self-pay | Admitting: Licensed Clinical Social Worker

## 2020-11-04 ENCOUNTER — Ambulatory Visit: Payer: Self-pay | Admitting: Licensed Clinical Social Worker

## 2020-11-04 DIAGNOSIS — C50412 Malignant neoplasm of upper-outer quadrant of left female breast: Secondary | ICD-10-CM

## 2020-11-04 DIAGNOSIS — Z1379 Encounter for other screening for genetic and chromosomal anomalies: Secondary | ICD-10-CM | POA: Insufficient documentation

## 2020-11-04 DIAGNOSIS — Z803 Family history of malignant neoplasm of breast: Secondary | ICD-10-CM

## 2020-11-04 DIAGNOSIS — Z801 Family history of malignant neoplasm of trachea, bronchus and lung: Secondary | ICD-10-CM

## 2020-11-04 NOTE — Telephone Encounter (Signed)
Revealed negative genetic testing.   This normal result is reassuring and indicates that it is unlikely Kim Cook's cancer is due to a hereditary cause.  It is unlikely that there is an increased risk of another cancer due to a mutation in one of these genes.  However, genetic testing is not perfect, and cannot definitively rule out a hereditary cause.  It will be important for her to keep in contact with genetics to learn if any additional testing may be needed in the future.

## 2020-11-04 NOTE — Progress Notes (Signed)
HPI:  [name] was previously seen in the [place] clinic due to a personal and family history of cancer and concerns regarding a hereditary predisposition to cancer. Please refer to our prior cancer genetics clinic note for more information regarding our discussion, assessment and recommendations, at the time. [name] recent genetic test results were disclosed to her, as were recommendations warranted by these results. These results and recommendations are discussed in more detail below.  CANCER HISTORY:  Oncology History  Carcinoma of upper-outer quadrant of female breast, left ([place])  08/16/2020 Initial Diagnosis   Carcinoma of upper-outer quadrant of female breast, left ([place])   08/16/2020 Cancer Staging   Staging form: Breast, AJCC 8th Edition - Clinical stage from 08/16/2020: Stage IA (cT1b, cN0, cM0, G1, ER+, PR+, HER2-) - Signed by Lloyd Huger, MD on 08/16/2020 Stage prefix: Initial diagnosis Histologic grading system: 3 grade system     Genetic Testing   Negative genetic testing. No pathogenic variants identified on the [facility] Multi-Cancer+RNA panel. The report date is 10.24/2022.  The Multi-Cancer Panel + RNA offered by [facility] includes sequencing and/or deletion duplication testing of the following 84 genes: AIP, ALK, APC, ATM, AXIN2,BAP1,  BARD1, BLM, BMPR1A, BRCA1, BRCA2, BRIP1, CASR, CDC73, CDH1, CDK4, CDKN1B, CDKN1C, CDKN2A (p14ARF), CDKN2A (p16INK4a), CEBPA, CHEK2, CTNNA1, DICER1, DIS3L2, EGFR (c.2369C>T, p.Thr790Met variant only), EPCAM (Deletion/duplication testing only), FH, FLCN, GATA2, GPC3, GREM1 (Promoter region deletion/duplication testing only), HOXB13 (c.251G>A, p.Gly84Glu), HRAS, KIT, MAX, MEN1, MET, MITF (c.952G>A, p.Glu318Lys variant only), MLH1, MSH2, MSH3, MSH6, MUTYH, NBN, NF1, NF2, NTHL1, PALB2, PDGFRA, PHOX2B, PMS2, POLD1, POLE, POT1, PRKAR1A, PTCH1, PTEN, RAD50, RAD51C, RAD51D, RB1, RECQL4, RET, RUNX1, SDHAF2, SDHA (sequence changes  only), SDHB, SDHC, SDHD, SMAD4, SMARCA4, SMARCB1, SMARCE1, STK11, SUFU, TERC, TERT, TMEM127, TP53, TSC1, TSC2, VHL, WRN and WT1.     FAMILY HISTORY:  We obtained a detailed, 4-generation family history.  Significant diagnoses are listed below: Family History  Problem Relation Age of Onset   Hypertension Mother    Coronary artery disease Mother    Heart attack Mother    Hypertension Father    Skin cancer Father    Breast cancer Maternal Aunt        dx late 6s   Lung cancer Maternal Uncle    Breast cancer Paternal Aunt        dx 7s-50s   Osteoporosis Maternal Grandmother    Breast cancer Paternal Grandmother        dx 20s-30s   Hypertension Son    Breast cancer Cousin 5       paternal side   [name] has 1 son and 1 daughter, 42 and 50, no cancers. She has 1 sister, 11, no history of cancer besides skin cancer.    [name] mother died at 92 of a heart attack. Patient had 2 maternal aunts, 1 uncle. An aunt had breast cancer in her late 20s, an uncle had lung cancer in his 34s. Maternal grandparents passed in their 47s.    [name] father died at 60. Patient had 2 paternal aunts, 1 uncle. One aunt had breast cancer in her late 69s and died in her late 17s-50s. A paternal cousin had breast in her 16s. Paternal grandmother had breast cancer in her 58s-30s and died from it. Paternal grandfather died in his 9s.    [name] is unaware of previous family history of genetic testing for hereditary cancer risks. Patient's maternal ancestors are of unknown descent, and paternal  ancestors are of unknown descent. There is no reported Ashkenazi Jewish ancestry. There is no known consanguinity.     GENETIC TEST RESULTS: Genetic testing reported out on 11/04/2020 through the [facility] Multi- cancer panel found no pathogenic mutations.   The Multi-Cancer Panel + RNA offered by [facility] includes sequencing and/or deletion duplication testing of the following 84 genes: AIP, ALK, APC,  ATM, AXIN2,BAP1,  BARD1, BLM, BMPR1A, BRCA1, BRCA2, BRIP1, CASR, CDC73, CDH1, CDK4, CDKN1B, CDKN1C, CDKN2A (p14ARF), CDKN2A (p16INK4a), CEBPA, CHEK2, CTNNA1, DICER1, DIS3L2, EGFR (c.2369C>T, p.Thr790Met variant only), EPCAM (Deletion/duplication testing only), FH, FLCN, GATA2, GPC3, GREM1 (Promoter region deletion/duplication testing only), HOXB13 (c.251G>A, p.Gly84Glu), HRAS, KIT, MAX, MEN1, MET, MITF (c.952G>A, p.Glu318Lys variant only), MLH1, MSH2, MSH3, MSH6, MUTYH, NBN, NF1, NF2, NTHL1, PALB2, PDGFRA, PHOX2B, PMS2, POLD1, POLE, POT1, PRKAR1A, PTCH1, PTEN, RAD50, RAD51C, RAD51D, RB1, RECQL4, RET, RUNX1, SDHAF2, SDHA (sequence changes only), SDHB, SDHC, SDHD, SMAD4, SMARCA4, SMARCB1, SMARCE1, STK11, SUFU, TERC, TERT, TMEM127, TP53, TSC1, TSC2, VHL, WRN and WT1.   The test report has been scanned into EPIC and is located under the Molecular Pathology section of the Results Review tab.  A portion of the result report is included below for reference.     We discussed that because current genetic testing is not perfect, it is possible there may be a gene mutation in one of these genes that current testing cannot detect, but that chance is small.  There could be another gene that has not yet been discovered, or that we have not yet tested, that is responsible for the cancer diagnoses in the family. It is also possible there is a hereditary cause for the cancer in the family that [name] did not inherit and therefore was not identified in her testing.  Therefore, it is important to remain in touch with cancer genetics in the future so that we can continue to offer [name] the most up to date genetic testing.   ADDITIONAL GENETIC TESTING: We discussed with [name] that her genetic testing was fairly extensive.  If there are genes identified to increase cancer risk that can be analyzed in the future, we would be happy to discuss and coordinate this testing at that time.    CANCER SCREENING  RECOMMENDATIONS: [name] test result is considered negative (normal).  This means that we have not identified a hereditary cause for her  personal and family history of cancer at this time. Most cancers happen by chance and this negative test suggests that her cancer may fall into this category.    While reassuring, this does not definitively rule out a hereditary predisposition to cancer. It is still possible that there could be genetic mutations that are undetectable by current technology. There could be genetic mutations in genes that have not been tested or identified to increase cancer risk.  Therefore, it is recommended she continue to follow the cancer management and screening guidelines provided by her oncology and primary healthcare provider.   An individual's cancer risk and medical management are not determined by genetic test results alone. Overall cancer risk assessment incorporates additional factors, including personal medical history, family history, and any available genetic information that may result in a personalized plan for cancer prevention and surveillance.  RECOMMENDATIONS FOR FAMILY MEMBERS:  Relatives in this family might be at some increased risk of developing cancer, over the general population risk, simply due to the family history of cancer.  We recommended female relatives in this family have a yearly mammogram  beginning at age 29, or 43 years younger than the earliest onset of cancer, an annual clinical breast exam, and perform monthly breast self-exams. Female relatives in this family should also have a gynecological exam as recommended by their primary provider.  All family members should be referred for colonoscopy starting at age 80.   It is also possible there is a hereditary cause for the cancer in [name]'s family that she did not inherit and therefore was not identified in her.  Based on [name]'s family history, we recommended paternal relatives have  genetic counseling and testing. [name] will let us know if we can be of any assistance in coordinating genetic counseling and/or testing for these family members.  FOLLOW-UP: Lastly, we discussed with [name] that cancer genetics is a rapidly advancing field and it is possible that new genetic tests will be appropriate for her and/or her family members in the future. We encouraged her to remain in contact with cancer genetics on an annual basis so we can update her personal and family histories and let her know of advances in cancer genetics that may benefit this family.   Our contact number was provided. [name] questions were answered to her satisfaction, and she knows she is welcome to call us at anytime with additional questions or concerns.   Faith Rogue, MS, [facility] Genetic Counselor [email or web address] Phone: [phone]

## 2020-12-09 NOTE — Progress Notes (Signed)
Kim Cook  [phone] Fax:([phone]) [phone]  ID: Kim Cook OB: 1950-06-08  MR#: [id number]  IRJ#:[id number]  Patient Care Team: Idelle Crouch, MD as PCP - General (Internal Medicine)  I connected with Kim Cook on 12/13/20 at  3:30 PM EST by video enabled telemedicine visit and verified that I am speaking with the correct person using two identifiers.   I discussed the limitations, risks, security and privacy concerns of performing an evaluation and management service by telemedicine and the availability of in-person appointments. I also discussed with the patient that there may be a patient responsible charge related to this service. The patient expressed understanding and agreed to proceed.   Other persons participating in the visit and their role in the encounter: Patient, MD.  Patient's location: Home. Provider's location: Clinic.  CHIEF COMPLAINT: Clinical stage Ia ER/PR positive, HER2 negative invasive carcinoma of the upper outer quadrant left breast.  Oncotype DX score 9, low risk.  INTERVAL HISTORY: Patient agreed to video assisted telemedicine visit for routine 33-monthevaluation and to assess her toleration of tamoxifen.  Letrozole was discontinued secondary to osteoporosis.  She currently feels well and is asymptomatic.  She is tolerating her treatments well without significant side effects.  She has no neurologic complaints.  She denies any recent fevers or illnesses.  She has a good appetite and denies weight loss.  She has no chest pain, shortness of breath, cough, or hemoptysis.  She denies any nausea, vomiting, constipation, or diarrhea.  She has no urinary complaints.  Patient offers no specific complaints today.  REVIEW OF SYSTEMS:   Review of Systems  Constitutional: Negative.  Negative for fever, malaise/fatigue and weight loss.  Respiratory: Negative.  Negative for cough, hemoptysis and shortness of breath.    Cardiovascular: Negative.  Negative for chest pain and leg swelling.  Gastrointestinal: Negative.  Negative for abdominal pain.  Genitourinary: Negative.  Negative for dysuria.  Musculoskeletal: Negative.  Negative for back pain.  Skin: Negative.  Negative for rash.  Neurological: Negative.  Negative for dizziness, focal weakness, weakness and headaches.  Psychiatric/Behavioral: Negative.  The patient is not nervous/anxious.    As per HPI. Otherwise, a complete review of systems is negative.  PAST MEDICAL HISTORY: Past Medical History:  Diagnosis Date   Family history of breast cancer    Family history of lung cancer    GERD (gastroesophageal reflux disease)    High cholesterol    History of kidney stones    Hypertension    Motion sickness    boats, back seats of cars   Osteoporosis    RLS (restless legs syndrome)    Vitamin D deficiency     PAST SURGICAL HISTORY: Past Surgical History:  Procedure Laterality Date   ABDOMINAL HYSTERECTOMY     Partial    BREAST CYST ASPIRATION Left    neg   CATARACT EXTRACTION W/PHACO Left 08/02/2019   Procedure: CATARACT EXTRACTION PHACO AND INTRAOCULAR LENS PLACEMENT ([place];  Surgeon: BLeandrew Koyanagi MD;  [facility]  Service: Ophthalmology;  Laterality: Left;  5.46 0:56.5 9.7%   CATARACT EXTRACTION W/PHACO Right 08/30/2019   Procedure: CATARACT EXTRACTION PHACO AND INTRAOCULAR LENS PLACEMENT ([facility] RIGHT;  Surgeon: BLeandrew Koyanagi MD;  [place] [facility]  Service: Ophthalmology;  Laterality: Right;  5.53 0:51.0 10.8%   COLONOSCOPY     06/15/00; 09/27/07   COLONOSCOPY WITH PROPOFOL N/A 12/06/2017   Procedure: COLONOSCOPY WITH PROPOFOL;  Surgeon: EManya Silvas MD;  Location: [facility];  Service: Endoscopy;  Laterality: N/A;   FOOT SURGERY  2000   SIMPLE MASTECTOMY WITH AXILLARY SENTINEL NODE BIOPSY Left 08/21/2020   Procedure: SIMPLE MASTECTOMY WITH AXILLARY SENTINEL NODE BIOPSY;   Surgeon: Robert Bellow, MD;  Location: [facility];  Service: General;  Laterality: Left;    FAMILY HISTORY: Family History  Problem Relation Age of Onset   Hypertension Mother    Coronary artery disease Mother    Heart attack Mother    Hypertension Father    Skin cancer Father    Breast cancer Maternal Aunt        dx late 11s   Lung cancer Maternal Uncle    Breast cancer Paternal Aunt        dx 40s-50s   Osteoporosis Maternal Grandmother    Breast cancer Paternal Grandmother        dx 20s-30s   Hypertension Son    Breast cancer Cousin 44       paternal side    ADVANCED DIRECTIVES (Y/N):  N  HEALTH MAINTENANCE: Social History   Tobacco Use   Smoking status: Every Day    Packs/day: 1.00    Years: 40.00    Pack years: 40.00    Types: Cigarettes   Smokeless tobacco: Never  Vaping Use   Vaping Use: Never used  Substance Use Topics   Alcohol use: No   Drug use: No     Colonoscopy:  PAP:  Bone density:  Lipid panel:  No Known Allergies  Current Outpatient Medications  Medication Sig Dispense Refill   alendronate (FOSAMAX) 70 MG tablet Take 70 mg by mouth every Monday. Take with a full glass of water on an empty stomach.     Ascorbic Acid (VITAMIN C PO) Take 500 mg by mouth at bedtime.     aspirin 81 MG chewable tablet Chew 81 mg by mouth at bedtime.     atorvastatin (LIPITOR) 20 MG tablet Take 20 mg by mouth at bedtime.     bisoprolol-hydrochlorothiazide (ZIAC) 10-6.25 MG tablet Take 1 tablet by mouth daily.     Cholecalciferol 25 MCG (1000 UT) capsule Take 1,000 Units by mouth at bedtime.     HYDROcodone-acetaminophen (NORCO/VICODIN) 5-325 MG tablet Take 1 tablet by mouth every 4 (four) hours as needed for moderate pain. 10 tablet 0   MELATONIN PO Take 5 mg by mouth at bedtime.     Salicylic Acid (WART REMOVER EX) Apply 1 application topically daily as needed (wart removal on arm).     tamoxifen (NOLVADEX) 20 MG tablet Take 1 tablet (20 mg total) by mouth  daily. Discontinue Letrozole 90 tablet 1   traZODone (DESYREL) 100 MG tablet Take 100 mg by mouth at bedtime.     vitamin B-12 (CYANOCOBALAMIN) 1000 MCG tablet Take 1,000 mcg by mouth at bedtime.     zinc gluconate 50 MG tablet Take 50 mg by mouth at bedtime.     No current facility-administered medications for this visit.    OBJECTIVE: There were no vitals filed for this visit.    There is no height or weight on file to calculate BMI.    ECOG FS:0 - Asymptomatic  General: Well-developed, well-nourished, no acute distress. HEENT: Normocephalic. Neuro: Alert, answering all questions appropriately. Cranial nerves grossly intact. Psych: Normal affect.   LAB RESULTS:  No results found for: NA, K, CL, CO2, GLUCOSE, BUN, CREATININE, CALCIUM, PROT, ALBUMIN, AST, ALT, ALKPHOS, BILITOT, GFRNONAA, GFRAA  No results found for:  WBC, NEUTROABS, HGB, HCT, MCV, PLT   STUDIES: No results found.  ASSESSMENT: Clinical stage Ia ER/PR positive, HER2 negative invasive carcinoma of the upper outer quadrant left breast.  PLAN:    Clinical stage Ia ER/PR positive, HER2 negative invasive carcinoma of the upper outer quadrant left breast: Patient underwent total mastectomy on August 21, 2020.  Because of this, she did not require adjuvant XRT.  Oncotype Dx = 9 is low risk therefore chemotherapy was not necessary.  Patient was initially started on letrozole, but then switched to tamoxifen given her underlying osteoporosis.  Continue treatment for total of 5 years completing in August 2027.  Patient will have video-assisted telemedicine visit in 6 months for routine evaluation.   Osteoporosis: Patient's most recent bone mineral density on October 02, 2020 revealed a T score of -4.0.  Discontinue tamoxifen as above.  Continue Fosamax, calcium, and vitamin D supplementation.  Can consider a switch to Prolia if no improvement on Fosamax.  Repeat in September 2023.  Video-assisted telemedicine visit as  above.   I provided 20 minutes of face-to-face video visit time during this encounter which included chart review, counseling, and coordination of care as documented above.   Patient expressed understanding and was in agreement with this plan. She also understands that She can call clinic at any time with any questions, concerns, or complaints.    Cancer Staging  Carcinoma of upper-outer quadrant of female breast, left [facility]) Staging form: Breast, AJCC 8th Edition - Clinical stage from 08/16/2020: Stage IA (cT1b, cN0, cM0, G1, ER+, PR+, HER2-) - Signed by Lloyd Huger, MD on 08/16/2020 Stage prefix: Initial diagnosis Histologic grading system: 3 grade system   Lloyd Huger, MD   12/13/2020 2:18 PM

## 2020-12-10 ENCOUNTER — Other Ambulatory Visit: Payer: Self-pay | Admitting: General Surgery

## 2020-12-10 DIAGNOSIS — R222 Localized swelling, mass and lump, trunk: Secondary | ICD-10-CM | POA: Diagnosis not present

## 2020-12-10 DIAGNOSIS — D213 Benign neoplasm of connective and other soft tissue of thorax: Secondary | ICD-10-CM | POA: Diagnosis not present

## 2020-12-11 ENCOUNTER — Ambulatory Visit: Payer: PPO | Admitting: Oncology

## 2020-12-11 LAB — SURGICAL PATHOLOGY

## 2020-12-12 ENCOUNTER — Inpatient Hospital Stay: Payer: PPO | Attending: Oncology | Admitting: Oncology

## 2020-12-12 DIAGNOSIS — C50412 Malignant neoplasm of upper-outer quadrant of left female breast: Secondary | ICD-10-CM

## 2020-12-24 ENCOUNTER — Telehealth: Payer: Self-pay | Admitting: Acute Care

## 2020-12-24 DIAGNOSIS — F17210 Nicotine dependence, cigarettes, uncomplicated: Secondary | ICD-10-CM

## 2020-12-24 DIAGNOSIS — Z87891 Personal history of nicotine dependence: Secondary | ICD-10-CM

## 2020-12-24 NOTE — Telephone Encounter (Signed)
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

## 2020-12-25 NOTE — Telephone Encounter (Signed)
Spoke with pt and scheduled yearly lung screening CT 01/07/21 10:30 at [facility]. Pt verbalized understanding.

## 2020-12-27 ENCOUNTER — Other Ambulatory Visit: Payer: Self-pay | Admitting: Oncology

## 2021-01-07 ENCOUNTER — Ambulatory Visit
Admission: RE | Admit: 2021-01-07 | Discharge: 2021-01-07 | Disposition: A | Discharge status: Home / Self Care | Payer: PPO | Source: Ambulatory Visit | Attending: Acute Care | Admitting: Acute Care

## 2021-01-07 ENCOUNTER — Other Ambulatory Visit: Payer: Self-pay

## 2021-01-07 DIAGNOSIS — Z87891 Personal history of nicotine dependence: Secondary | ICD-10-CM | POA: Insufficient documentation

## 2021-01-07 DIAGNOSIS — F17210 Nicotine dependence, cigarettes, uncomplicated: Secondary | ICD-10-CM | POA: Insufficient documentation

## 2021-01-09 ENCOUNTER — Other Ambulatory Visit: Payer: Self-pay

## 2021-01-09 DIAGNOSIS — Z87891 Personal history of nicotine dependence: Secondary | ICD-10-CM

## 2021-01-09 DIAGNOSIS — F17210 Nicotine dependence, cigarettes, uncomplicated: Secondary | ICD-10-CM

## 2021-01-14 DIAGNOSIS — Z720 Tobacco use: Secondary | ICD-10-CM | POA: Diagnosis not present

## 2021-01-14 DIAGNOSIS — I10 Essential (primary) hypertension: Secondary | ICD-10-CM | POA: Diagnosis not present

## 2021-01-14 DIAGNOSIS — E7800 Pure hypercholesterolemia, unspecified: Secondary | ICD-10-CM | POA: Diagnosis not present

## 2021-01-14 DIAGNOSIS — R0602 Shortness of breath: Secondary | ICD-10-CM | POA: Diagnosis not present

## 2021-01-14 DIAGNOSIS — I25118 Atherosclerotic heart disease of native coronary artery with other forms of angina pectoris: Secondary | ICD-10-CM | POA: Diagnosis not present

## 2021-01-14 DIAGNOSIS — I700 Atherosclerosis of aorta: Secondary | ICD-10-CM | POA: Diagnosis not present

## 2021-02-05 DIAGNOSIS — I25118 Atherosclerotic heart disease of native coronary artery with other forms of angina pectoris: Secondary | ICD-10-CM | POA: Diagnosis not present

## 2021-02-05 DIAGNOSIS — I6523 Occlusion and stenosis of bilateral carotid arteries: Secondary | ICD-10-CM | POA: Diagnosis not present

## 2021-02-05 DIAGNOSIS — R0602 Shortness of breath: Secondary | ICD-10-CM | POA: Diagnosis not present

## 2021-02-05 DIAGNOSIS — I700 Atherosclerosis of aorta: Secondary | ICD-10-CM | POA: Diagnosis not present

## 2021-02-07 DIAGNOSIS — E785 Hyperlipidemia, unspecified: Secondary | ICD-10-CM | POA: Diagnosis not present

## 2021-02-07 DIAGNOSIS — I10 Essential (primary) hypertension: Secondary | ICD-10-CM | POA: Diagnosis not present

## 2021-02-07 DIAGNOSIS — M810 Age-related osteoporosis without current pathological fracture: Secondary | ICD-10-CM | POA: Diagnosis not present

## 2021-02-07 DIAGNOSIS — Z7982 Long term (current) use of aspirin: Secondary | ICD-10-CM | POA: Diagnosis not present

## 2021-02-07 DIAGNOSIS — F17210 Nicotine dependence, cigarettes, uncomplicated: Secondary | ICD-10-CM | POA: Diagnosis not present

## 2021-02-07 DIAGNOSIS — C50919 Malignant neoplasm of unspecified site of unspecified female breast: Secondary | ICD-10-CM | POA: Diagnosis not present

## 2021-02-07 DIAGNOSIS — D8481 Immunodeficiency due to conditions classified elsewhere: Secondary | ICD-10-CM | POA: Diagnosis not present

## 2021-02-07 DIAGNOSIS — G4700 Insomnia, unspecified: Secondary | ICD-10-CM | POA: Diagnosis not present

## 2021-02-13 DIAGNOSIS — I25118 Atherosclerotic heart disease of native coronary artery with other forms of angina pectoris: Secondary | ICD-10-CM | POA: Diagnosis not present

## 2021-02-13 DIAGNOSIS — I739 Peripheral vascular disease, unspecified: Secondary | ICD-10-CM | POA: Diagnosis not present

## 2021-03-06 DIAGNOSIS — I6523 Occlusion and stenosis of bilateral carotid arteries: Secondary | ICD-10-CM | POA: Diagnosis not present

## 2021-03-06 DIAGNOSIS — I10 Essential (primary) hypertension: Secondary | ICD-10-CM | POA: Diagnosis not present

## 2021-03-06 DIAGNOSIS — I700 Atherosclerosis of aorta: Secondary | ICD-10-CM | POA: Diagnosis not present

## 2021-03-06 DIAGNOSIS — I2510 Atherosclerotic heart disease of native coronary artery without angina pectoris: Secondary | ICD-10-CM | POA: Diagnosis not present

## 2021-03-06 DIAGNOSIS — E7800 Pure hypercholesterolemia, unspecified: Secondary | ICD-10-CM | POA: Diagnosis not present

## 2021-03-19 ENCOUNTER — Other Ambulatory Visit: Payer: Self-pay | Admitting: *Deleted

## 2021-03-19 MED ORDER — TAMOXIFEN CITRATE 20 MG PO TABS: 20.0000 mg | ORAL_TABLET | Freq: Every day | ORAL | 1 refills | Status: DC

## 2021-04-03 DIAGNOSIS — H43813 Vitreous degeneration, bilateral: Secondary | ICD-10-CM | POA: Diagnosis not present

## 2021-04-07 ENCOUNTER — Other Ambulatory Visit: Payer: Self-pay | Admitting: Internal Medicine

## 2021-04-07 DIAGNOSIS — M810 Age-related osteoporosis without current pathological fracture: Secondary | ICD-10-CM | POA: Diagnosis not present

## 2021-04-07 DIAGNOSIS — Z720 Tobacco use: Secondary | ICD-10-CM | POA: Diagnosis not present

## 2021-04-07 DIAGNOSIS — Z0000 Encounter for general adult medical examination without abnormal findings: Secondary | ICD-10-CM | POA: Diagnosis not present

## 2021-04-07 DIAGNOSIS — J431 Panlobular emphysema: Secondary | ICD-10-CM | POA: Diagnosis not present

## 2021-04-07 DIAGNOSIS — E559 Vitamin D deficiency, unspecified: Secondary | ICD-10-CM | POA: Diagnosis not present

## 2021-04-07 DIAGNOSIS — I2510 Atherosclerotic heart disease of native coronary artery without angina pectoris: Secondary | ICD-10-CM | POA: Diagnosis not present

## 2021-04-07 DIAGNOSIS — C50412 Malignant neoplasm of upper-outer quadrant of left female breast: Secondary | ICD-10-CM | POA: Diagnosis not present

## 2021-04-07 DIAGNOSIS — E7800 Pure hypercholesterolemia, unspecified: Secondary | ICD-10-CM | POA: Diagnosis not present

## 2021-04-07 DIAGNOSIS — I10 Essential (primary) hypertension: Secondary | ICD-10-CM | POA: Diagnosis not present

## 2021-04-07 DIAGNOSIS — F5104 Psychophysiologic insomnia: Secondary | ICD-10-CM | POA: Diagnosis not present

## 2021-04-07 DIAGNOSIS — Z79899 Other long term (current) drug therapy: Secondary | ICD-10-CM | POA: Diagnosis not present

## 2021-04-09 DIAGNOSIS — D0439 Carcinoma in situ of skin of other parts of face: Secondary | ICD-10-CM | POA: Diagnosis not present

## 2021-04-09 DIAGNOSIS — D485 Neoplasm of uncertain behavior of skin: Secondary | ICD-10-CM | POA: Diagnosis not present

## 2021-04-09 DIAGNOSIS — L920 Granuloma annulare: Secondary | ICD-10-CM | POA: Diagnosis not present

## 2021-04-09 DIAGNOSIS — Z859 Personal history of malignant neoplasm, unspecified: Secondary | ICD-10-CM | POA: Diagnosis not present

## 2021-04-09 DIAGNOSIS — L578 Other skin changes due to chronic exposure to nonionizing radiation: Secondary | ICD-10-CM | POA: Diagnosis not present

## 2021-04-17 DIAGNOSIS — C50412 Malignant neoplasm of upper-outer quadrant of left female breast: Secondary | ICD-10-CM | POA: Diagnosis not present

## 2021-05-27 DIAGNOSIS — C44329 Squamous cell carcinoma of skin of other parts of face: Secondary | ICD-10-CM | POA: Diagnosis not present

## 2021-05-27 DIAGNOSIS — L988 Other specified disorders of the skin and subcutaneous tissue: Secondary | ICD-10-CM | POA: Diagnosis not present

## 2021-06-04 ENCOUNTER — Other Ambulatory Visit: Payer: Self-pay | Admitting: General Surgery

## 2021-06-04 DIAGNOSIS — Z1231 Encounter for screening mammogram for malignant neoplasm of breast: Secondary | ICD-10-CM

## 2021-06-06 NOTE — Progress Notes (Unsigned)
[place]  [phone] Fax:(336) [phone]  ID: Kim Cook OB: [birth year]  MR#: [id number]  KZL#:[id number]  Patient Care Team: Idelle Crouch, MD as PCP - General (Internal Medicine)  I connected with Kim Cook on 06/13/21 at  2:45 PM EDT by video enabled telemedicine visit and verified that I am speaking with the correct person using two identifiers.   I discussed the limitations, risks, security and privacy concerns of performing an evaluation and management service by telemedicine and the availability of in-person appointments. I also discussed with the patient that there may be a patient responsible charge related to this service. The patient expressed understanding and agreed to proceed.   Other persons participating in the visit and their role in the encounter: Patient, MD.  Patient's location: Home. Provider's location: Clinic.  CHIEF COMPLAINT: Clinical stage Ia ER/PR positive, HER2 negative invasive carcinoma of the upper outer quadrant left breast.  Oncotype DX score 9, low risk.  INTERVAL HISTORY: Patient agreed to video assisted telemedicine visit which was subsequently converted to telephone only secondary to technical difficulties.  She currently feels well and is asymptomatic.  She is tolerating tamoxifen and Fosamax well without significant side effects.  She has no neurologic complaints.  She denies any recent fevers or illnesses.  She has a good appetite and denies weight loss.  She has no chest pain, shortness of breath, cough, or hemoptysis.  She denies any nausea, vomiting, constipation, or diarrhea.  She has no urinary complaints.  Patient offers no specific complaints today.    REVIEW OF SYSTEMS:   Review of Systems  Constitutional: Negative.  Negative for fever, malaise/fatigue and weight loss.  Respiratory: Negative.  Negative for cough, hemoptysis and shortness of breath.   Cardiovascular: Negative.  Negative for  chest pain and leg swelling.  Gastrointestinal: Negative.  Negative for abdominal pain.  Genitourinary: Negative.  Negative for dysuria.  Musculoskeletal: Negative.  Negative for back pain.  Skin: Negative.  Negative for rash.  Neurological: Negative.  Negative for dizziness, focal weakness, weakness and headaches.  Psychiatric/Behavioral: Negative.  The patient is not nervous/anxious.    As per HPI. Otherwise, a complete review of systems is negative.  PAST MEDICAL HISTORY: Past Medical History:  Diagnosis Date   Family history of breast cancer    Family history of lung cancer    GERD (gastroesophageal reflux disease)    High cholesterol    History of kidney stones    Hypertension    Motion sickness    boats, back seats of cars   Osteoporosis    RLS (restless legs syndrome)    Vitamin D deficiency     PAST SURGICAL HISTORY: Past Surgical History:  Procedure Laterality Date   ABDOMINAL HYSTERECTOMY     Partial    BREAST CYST ASPIRATION Left    neg   CATARACT EXTRACTION W/PHACO Left 08/02/2019   Procedure: CATARACT EXTRACTION PHACO AND INTRAOCULAR LENS PLACEMENT ([place]) LEFT;  Surgeon: Leandrew Koyanagi, MD;  Location: [place];  Service: Ophthalmology;  Laterality: Left;  5.46 0:56.5 9.7%   CATARACT EXTRACTION W/PHACO Right 08/30/2019   Procedure: CATARACT EXTRACTION PHACO AND INTRAOCULAR LENS PLACEMENT ([place]) RIGHT;  Surgeon: Leandrew Koyanagi, MD;  Location: [place];  Service: Ophthalmology;  Laterality: Right;  5.53 0:51.0 10.8%   COLONOSCOPY     06/15/00; 09/27/07   COLONOSCOPY WITH PROPOFOL N/A 12/06/2017   Procedure: COLONOSCOPY WITH PROPOFOL;  Surgeon: Manya Silvas, MD;  Location: [facility] ENDOSCOPY;  Service: Endoscopy;  Laterality: N/A;   FOOT SURGERY  2000   SIMPLE MASTECTOMY WITH AXILLARY SENTINEL NODE BIOPSY Left 08/21/2020   Procedure: SIMPLE MASTECTOMY WITH AXILLARY SENTINEL NODE BIOPSY;  Surgeon: Robert Bellow, MD;  Location:  [facility] ORS;  Service: General;  Laterality: Left;    FAMILY HISTORY: Family History  Problem Relation Age of Onset   Hypertension Mother    Coronary artery disease Mother    Heart attack Mother    Hypertension Father    Skin cancer Father    Breast cancer Maternal Aunt        dx late 74s   Lung cancer Maternal Uncle    Breast cancer Paternal Aunt        dx 40s-50s   Osteoporosis Maternal Grandmother    Breast cancer Paternal Grandmother        dx 20s-30s   Hypertension Son    Breast cancer Cousin 81       paternal side    ADVANCED DIRECTIVES (Y/N):  N  HEALTH MAINTENANCE: Social History   Tobacco Use   Smoking status: Every Day    Packs/day: 1.00    Years: 40.00    Pack years: 40.00    Types: Cigarettes   Smokeless tobacco: Never  Vaping Use   Vaping Use: Never used  Substance Use Topics   Alcohol use: No   Drug use: No     Colonoscopy:  PAP:  Bone density:  Lipid panel:  No Known Allergies  Current Outpatient Medications  Medication Sig Dispense Refill   alendronate (FOSAMAX) 70 MG tablet Take 70 mg by mouth every Monday. Take with a full glass of water on an empty stomach.     Ascorbic Acid (VITAMIN C PO) Take 500 mg by mouth at bedtime.     aspirin 81 MG chewable tablet Chew 81 mg by mouth at bedtime.     atorvastatin (LIPITOR) 20 MG tablet Take 20 mg by mouth at bedtime.     bisoprolol-hydrochlorothiazide (ZIAC) 10-6.25 MG tablet Take 1 tablet by mouth daily.     MELATONIN PO Take 5 mg by mouth at bedtime.     tamoxifen (NOLVADEX) 20 MG tablet Take 1 tablet (20 mg total) by mouth daily. 90 tablet 1   traZODone (DESYREL) 100 MG tablet Take 100 mg by mouth at bedtime.     vitamin B-12 (CYANOCOBALAMIN) 1000 MCG tablet Take 1,000 mcg by mouth at bedtime.     zinc gluconate 50 MG tablet Take 50 mg by mouth at bedtime.     Cholecalciferol 25 MCG (1000 UT) capsule Take 1,000 Units by mouth at bedtime.     HYDROcodone-acetaminophen (NORCO/VICODIN) 5-325 MG  tablet Take 1 tablet by mouth every 4 (four) hours as needed for moderate pain. (Patient not taking: Reported on 06/12/2021) 10 tablet 0   Salicylic Acid (WART REMOVER EX) Apply 1 application topically daily as needed (wart removal on arm). (Patient not taking: Reported on 06/12/2021)     No current facility-administered medications for this visit.    OBJECTIVE: There were no vitals filed for this visit.    There is no height or weight on file to calculate BMI.    ECOG FS:0 - Asymptomatic   LAB RESULTS:  No results found for: NA, K, CL, CO2, GLUCOSE, BUN, CREATININE, CALCIUM, PROT, ALBUMIN, AST, ALT, ALKPHOS, BILITOT, GFRNONAA, GFRAA  No results found for: WBC, NEUTROABS, HGB, HCT, MCV, PLT   STUDIES: No results found.  ASSESSMENT: Clinical stage  Ia ER/PR positive, HER2 negative invasive carcinoma of the upper outer quadrant left breast.  PLAN:    Clinical stage Ia ER/PR positive, HER2 negative invasive carcinoma of the upper outer quadrant left breast: Patient underwent total mastectomy on August 21, 2020.  Because of this, she did not require adjuvant XRT.  Oncotype Dx = 9 is low risk therefore chemotherapy was not necessary.  Patient was initially started on letrozole, but then switched to tamoxifen given her underlying osteoporosis.  Continue treatment for total of 5 years completing in August 2027.  Patient will require repeat mammogram in July 2023.   Osteoporosis: Patient's most recent bone mineral density on October 02, 2020 revealed a T score of -4.0.  Discontinue tamoxifen as above.  Continue Fosamax, calcium, and vitamin D supplementation.  Can consider a switch to Prolia if no improvement on Fosamax.  Repeat in September 2023.  Patient will have video-assisted telemedicine visit 1 to 2 days after her bone mineral density at which point she can likely be switched back to evaluation every 6 months.   I provided 20 minutes of face-to-face video visit time during this encounter  which included chart review, counseling, and coordination of care as documented above.   Patient expressed understanding and was in agreement with this plan. She also understands that She can call clinic at any time with any questions, concerns, or complaints.    Cancer Staging  Carcinoma of upper-outer quadrant of female breast, left [facility]) Staging form: Breast, AJCC 8th Edition - Clinical stage from 08/16/2020: Stage IA (cT1b, cN0, cM0, G1, ER+, PR+, HER2-) - Signed by Lloyd Huger, MD on 08/16/2020 Stage prefix: Initial diagnosis Histologic grading system: 3 grade system   Lloyd Huger, MD   06/13/2021 5:54 AM

## 2021-06-12 ENCOUNTER — Inpatient Hospital Stay: Payer: PPO | Attending: Oncology | Admitting: Oncology

## 2021-06-12 ENCOUNTER — Encounter: Payer: Self-pay | Admitting: Oncology

## 2021-06-12 DIAGNOSIS — Z79811 Long term (current) use of aromatase inhibitors: Secondary | ICD-10-CM | POA: Diagnosis not present

## 2021-06-12 DIAGNOSIS — C50412 Malignant neoplasm of upper-outer quadrant of left female breast: Secondary | ICD-10-CM | POA: Diagnosis not present

## 2021-07-23 ENCOUNTER — Ambulatory Visit
Admission: RE | Admit: 2021-07-23 | Discharge: 2021-07-23 | Disposition: A | Discharge status: Home / Self Care | Payer: PPO | Source: Ambulatory Visit | Attending: General Surgery | Admitting: General Surgery

## 2021-07-23 DIAGNOSIS — Z1231 Encounter for screening mammogram for malignant neoplasm of breast: Secondary | ICD-10-CM | POA: Diagnosis not present

## 2021-07-29 DIAGNOSIS — C50412 Malignant neoplasm of upper-outer quadrant of left female breast: Secondary | ICD-10-CM | POA: Diagnosis not present

## 2021-08-07 DIAGNOSIS — Z4432 Encounter for fitting and adjustment of external left breast prosthesis: Secondary | ICD-10-CM | POA: Diagnosis not present

## 2021-08-07 DIAGNOSIS — C50112 Malignant neoplasm of central portion of left female breast: Secondary | ICD-10-CM | POA: Diagnosis not present

## 2021-10-03 NOTE — Progress Notes (Signed)
[place]  Telephone:(336) [phone] Fax:(336) [phone]  ID: Kim Cook OB: 26-Sep-1950  MR#: [id number]  ZCH#:[id number]  Patient Care Team: Idelle Crouch, MD as PCP - General (Internal Medicine)  I connected with Kim Cook on 10/10/21 at  3:30 PM EDT by video enabled telemedicine visit and verified that I am speaking with the correct person using two identifiers.   I discussed the limitations, risks, security and privacy concerns of performing an evaluation and management service by telemedicine and the availability of in-person appointments. I also discussed with the patient that there may be a patient responsible charge related to this service. The patient expressed understanding and agreed to proceed.   Other persons participating in the visit and their role in the encounter: Patient, MD.  Patient's location: Home. Provider's location: Clinic.  CHIEF COMPLAINT: Clinical stage Ia ER/PR positive, HER2 negative invasive carcinoma of the upper outer quadrant left breast.  Oncotype DX score 9, low risk.  INTERVAL HISTORY: Patient agreed to video assisted telemedicine visit for routine 13-monthevaluation.  She currently feels well and is asymptomatic.  She is tolerating tamoxifen and Fosamax without significant side effects. She has no neurologic complaints.  She denies any recent fevers or illnesses.  She has a good appetite and denies weight loss.  She has no chest pain, shortness of breath, cough, or hemoptysis.  She denies any nausea, vomiting, constipation, or diarrhea.  She has no urinary complaints.  Patient offers no specific complaints today.  REVIEW OF SYSTEMS:   Review of Systems  Constitutional: Negative.  Negative for fever, malaise/fatigue and weight loss.  Respiratory: Negative.  Negative for cough, hemoptysis and shortness of breath.   Cardiovascular: Negative.  Negative for chest pain and leg swelling.  Gastrointestinal: Negative.   Negative for abdominal pain.  Genitourinary: Negative.  Negative for dysuria.  Musculoskeletal: Negative.  Negative for back pain.  Skin: Negative.  Negative for rash.  Neurological: Negative.  Negative for dizziness, focal weakness, weakness and headaches.  Psychiatric/Behavioral: Negative.  The patient is not nervous/anxious.     As per HPI. Otherwise, a complete review of systems is negative.  PAST MEDICAL HISTORY: Past Medical History:  Diagnosis Date   Breast cancer ([place] 2022   Left Breast   Family history of breast cancer    Family history of lung cancer    GERD (gastroesophageal reflux disease)    High cholesterol    History of kidney stones    Hypertension    Motion sickness    boats, back seats of cars   Osteoporosis    RLS (restless legs syndrome)    Vitamin D deficiency     PAST SURGICAL HISTORY: Past Surgical History:  Procedure Laterality Date   ABDOMINAL HYSTERECTOMY     Partial    BREAST CYST ASPIRATION Left    neg   CATARACT EXTRACTION W/PHACO Left 08/02/2019   Procedure: CATARACT EXTRACTION PHACO AND INTRAOCULAR LENS PLACEMENT [place];  Surgeon: BLeandrew Koyanagi MD;  [place] [facility]  Service: Ophthalmology;  Laterality: Left;  5.46 0:56.5 9.7%   CATARACT EXTRACTION W/PHACO Right 08/30/2019   Procedure: CATARACT EXTRACTION PHACO AND INTRAOCULAR LENS PLACEMENT [place];  Surgeon: BLeandrew Koyanagi MD;  [place]  Service: Ophthalmology;  Laterality: Right;  5.53 0:51.0 10.8%   COLONOSCOPY     06/15/00; 09/27/07   COLONOSCOPY WITH PROPOFOL N/A 12/06/2017   Procedure: COLONOSCOPY WITH PROPOFOL;  Surgeon: EManya Silvas MD;  [facility];  Service: Endoscopy;  Laterality: N/A;   FOOT SURGERY  2000   MASTECTOMY Left 2022   SIMPLE MASTECTOMY WITH AXILLARY SENTINEL NODE BIOPSY Left 08/21/2020   Procedure: SIMPLE MASTECTOMY WITH AXILLARY SENTINEL NODE BIOPSY;  Surgeon: Robert Bellow, MD;   Location: [facility] ORS;  Service: General;  Laterality: Left;    FAMILY HISTORY: Family History  Problem Relation Age of Onset   Hypertension Mother    Coronary artery disease Mother    Heart attack Mother    Hypertension Father    Skin cancer Father    Breast cancer Maternal Aunt        dx late 44s   Lung cancer Maternal Uncle    Breast cancer Paternal Aunt        dx 40s-50s   Osteoporosis Maternal Grandmother    Breast cancer Paternal Grandmother        dx 20s-30s   Hypertension Son    Breast cancer Cousin 64       paternal side    ADVANCED DIRECTIVES (Y/N):  N  HEALTH MAINTENANCE: Social History   Tobacco Use   Smoking status: Every Day    Packs/day: 1.00    Years: 40.00    Total pack years: 40.00    Types: Cigarettes   Smokeless tobacco: Never  Vaping Use   Vaping Use: Never used  Substance Use Topics   Alcohol use: No   Drug use: No     Colonoscopy:  PAP:  Bone density:  Lipid panel:  No Known Allergies  Current Outpatient Medications  Medication Sig Dispense Refill   alendronate (FOSAMAX) 70 MG tablet Take 70 mg by mouth every Monday. Take with a full glass of water on an empty stomach.     Ascorbic Acid (VITAMIN C PO) Take 500 mg by mouth at bedtime.     aspirin 81 MG chewable tablet Chew 81 mg by mouth at bedtime.     atorvastatin (LIPITOR) 20 MG tablet Take 20 mg by mouth at bedtime.     bisoprolol-hydrochlorothiazide (ZIAC) 10-6.25 MG tablet Take 1 tablet by mouth daily.     Cholecalciferol 25 MCG (1000 UT) capsule Take 1,000 Units by mouth at bedtime.     MELATONIN PO Take 5 mg by mouth at bedtime.     traZODone (DESYREL) 100 MG tablet Take 100 mg by mouth at bedtime.     vitamin B-12 (CYANOCOBALAMIN) 1000 MCG tablet Take 1,000 mcg by mouth at bedtime.     zinc gluconate 50 MG tablet Take 50 mg by mouth at bedtime.     Salicylic Acid (WART REMOVER EX) Apply 1 application topically daily as needed (wart removal on arm). (Patient not taking:  Reported on 06/12/2021)     tamoxifen (NOLVADEX) 20 MG tablet Take 1 tablet (20 mg total) by mouth daily. 90 tablet 3   No current facility-administered medications for this visit.    OBJECTIVE: There were no vitals filed for this visit.    There is no height or weight on file to calculate BMI.    ECOG FS:0 - Asymptomatic  General: Well-developed, well-nourished, no acute distress. HEENT: Normocephalic. Neuro: Alert, answering all questions appropriately. Cranial nerves grossly intact. Psych: Normal affect.  LAB RESULTS:  No results found for: "NA", "K", "CL", "CO2", "GLUCOSE", "BUN", "CREATININE", "CALCIUM", "PROT", "ALBUMIN", "AST", "ALT", "ALKPHOS", "BILITOT", "GFRNONAA", "GFRAA"  No results found for: "WBC", "NEUTROABS", "HGB", "HCT", "MCV", "PLT"   STUDIES: DG Bone Density  Result Date: 10/06/2021 EXAM: DUAL X-RAY  ABSORPTIOMETRY (DXA) FOR BONE MINERAL DENSITY IMPRESSION: Your patient Kim Cook completed a BMD test on 10/06/2021 using the [place] (software version: 14.10) manufactured by [employer]. The following summarizes the results of our evaluation. Technologist::TNB PATIENT BIOGRAPHICAL: Name: Kim, Cook Patient ID: [id number] Birth Date: 07/21/1950 Height: 64.5 in. Gender: Female Exam Date: 10/06/2021 Weight: 127.8 lbs. Indications: Caucasian, COPD, History of Breast Cancer, Hysterectomy, Postmenopausal, Tobacco User Fractures: Ribs Treatments: Calcium, Fosamax, Tamoxifen, Vitamin D DENSITOMETRY RESULTS: Site         Region     Measured Date Measured Age WHO Classification Young Adult T-score BMD         %Change vs. Previous Significant Change (*) DualFemur Neck Left 10/06/2021 72.6 Osteopenia -2.0 0.758 g/cm2 1.2% - DualFemur Neck Left 10/02/2020 71.6 Osteopenia -2.1 0.749 g/cm2 -8.3% Yes DualFemur Neck Left 11/21/2009 60.7 Osteopenia -1.6 0.817 g/cm2 - - DualFemur Total Mean 10/06/2021 72.6 Osteopenia -1.7 0.792 g/cm2 -0.8% - DualFemur  Total Mean 10/02/2020 71.6 Osteopenia -1.7 0.798 g/cm2 -15.2% Yes DualFemur Total Mean 11/21/2009 60.7 Normal -0.5 0.941 g/cm2 - - Left Forearm Radius 33% 10/06/2021 72.6 Osteoporosis -4.2 0.504 g/cm2 -4.7% - Left Forearm Radius 33% 10/02/2020 71.6 Osteoporosis -4.0 0.529 g/cm2 -33.2% Yes Left Forearm Radius 33% 11/21/2009 60.7 Normal -1.0 0.792 g/cm2 - - ASSESSMENT: The BMD measured at Forearm Radius 33% is 0.504 g/cm2 with a T-score of -4.2. This patient is considered osteoporotic according to [facility]) criteria. Compared with prior study, there has been no significant change in the left femur. Compared with prior study, there has been no significant change in the total mean. Compared with prior study, there has been no significant change in the left forearm. Lumbar spine was not utilized due to advanced degenerative changes. Patient is not a candidate for FRAX due to Fosamax. The scan quality is good. World [facility]) criteria for post-menopausal, Caucasian Women: Normal:                   T-score at or above -1 SD Osteopenia/low bone mass: T-score between -1 and -2.5 SD Osteoporosis:             T-score at or below -2.5 SD RECOMMENDATIONS: 1. All patients should optimize calcium and vitamin D intake. 2. Consider FDA-approved medical therapies in postmenopausal women and men aged 49 years and older, based on the following: a. A hip or vertebral(clinical or morphometric) fracture b. T-score < -2.5 at the femoral neck or spine after appropriate evaluation to exclude secondary causes c. Low bone mass (T-score between -1.0 and -2.5 at the femoral neck or spine) and a 10-year probability of a hip fracture > 3% or a 10-year probability of a major osteoporosis-related fracture > 20% based on the [place]-adapted WHO algorithm 3. Clinician judgment and/or patient preferences may indicate treatment for people with 10-year fracture probabilities above or below these levels FOLLOW-UP: People  with diagnosed cases of osteoporosis or at high risk for fracture should have regular bone mineral density tests. For patients eligible for Medicare, routine testing is allowed once every 2 years. The testing frequency can be increased to one year for patients who have rapidly progressing disease, those who are receiving or discontinuing medical therapy to restore bone mass, or have additional risk factors. I have reviewed this report, and agree with the above findings. [facility] Electronically Signed   By: Dorise Bullion III M.D.   On: 10/06/2021 09:27    ASSESSMENT:  Clinical stage Ia ER/PR positive, HER2 negative invasive carcinoma of the upper outer quadrant left breast.  PLAN:    Clinical stage Ia ER/PR positive, HER2 negative invasive carcinoma of the upper outer quadrant left breast: Patient underwent total mastectomy on August 21, 2020.  Because of this, she did not require adjuvant XRT.  Oncotype Dx = 9 is low risk therefore chemotherapy was not necessary.  Patient was initially started on letrozole, but then switched to tamoxifen given her underlying osteoporosis.  Continue treatment for total of 5 years completing in August 2027.  Her most recent mammogram on July 23, 2021 was reported as BI-RADS 1.  Repeat in July 2024. Osteoporosis: Patient's most recent bone mineral density on October 06, 2021 reported T score of -4.2 which is slightly worse than 1 year prior with a T score was reported -4.0.  Continue tamoxifen and Fosamax as prescribed.  Patient also takes calcium and vitamin D supplementation.  Plan to repeat bone mineral density in September 2024 and if continued decline, will discontinue Fosamax and transition patient to Prolia.  Return to clinic as above.    I provided 20 minutes of face-to-face video visit time during this encounter which included chart review, counseling, and coordination of care as documented above.    Patient expressed understanding and was in  agreement with this plan. She also understands that She can call clinic at any time with any questions, concerns, or complaints.    Cancer Staging  Carcinoma of upper-outer quadrant of female breast, left [facility]) Staging form: Breast, AJCC 8th Edition - Clinical stage from 08/16/2020: Stage IA (cT1b, cN0, cM0, G1, ER+, PR+, HER2-) - Signed by Lloyd Huger, MD on 08/16/2020 Stage prefix: Initial diagnosis Histologic grading system: 3 grade system   Lloyd Huger, MD   10/10/2021 1:37 PM

## 2021-10-06 ENCOUNTER — Ambulatory Visit
Admission: RE | Admit: 2021-10-06 | Discharge: 2021-10-06 | Disposition: A | Discharge status: Home / Self Care | Payer: PPO | Source: Ambulatory Visit | Attending: Oncology | Admitting: Oncology

## 2021-10-06 DIAGNOSIS — Z79811 Long term (current) use of aromatase inhibitors: Secondary | ICD-10-CM | POA: Diagnosis not present

## 2021-10-06 DIAGNOSIS — M810 Age-related osteoporosis without current pathological fracture: Secondary | ICD-10-CM | POA: Diagnosis not present

## 2021-10-06 DIAGNOSIS — C50412 Malignant neoplasm of upper-outer quadrant of left female breast: Secondary | ICD-10-CM | POA: Diagnosis not present

## 2021-10-09 ENCOUNTER — Inpatient Hospital Stay: Payer: PPO | Attending: Oncology | Admitting: Oncology

## 2021-10-09 DIAGNOSIS — C50412 Malignant neoplasm of upper-outer quadrant of left female breast: Secondary | ICD-10-CM

## 2021-10-09 MED ORDER — TAMOXIFEN CITRATE 20 MG PO TABS: 20.0000 mg | ORAL_TABLET | Freq: Every day | ORAL | 3 refills | Status: DC

## 2021-10-31 ENCOUNTER — Ambulatory Visit (LOCAL_COMMUNITY_HEALTH_CENTER): Payer: PPO

## 2021-10-31 DIAGNOSIS — Z23 Encounter for immunization: Secondary | ICD-10-CM

## 2021-10-31 DIAGNOSIS — Z719 Counseling, unspecified: Secondary | ICD-10-CM

## 2021-10-31 NOTE — Progress Notes (Addendum)
  Are you feeling sick today? No   Have you ever received a dose of COVID-19 Vaccine? [employer], [place], [place], [place], Other) Yes  If yes, which vaccine and how many doses?   PFIZER, 4   Did you bring the vaccination record card or other documentation?  Yes   Do you have a health condition or are undergoing treatment that makes you moderately or severely immunocompromised? This would include, but not be limited to: cancer, HIV, organ transplant, immunosuppressive therapy/high-dose corticosteroids, or moderate/severe primary immunodeficiency.  YES (HX of breast CA)  Have you received COVID-19 vaccine before or during hematopoietic cell transplant (HCT) or CAR-T-cell therapies? No  Have you ever had an allergic reaction to: (This would include a severe allergic reaction or a reaction that caused hives, swelling, or respiratory distress, including wheezing.) A component of a COVID-19 vaccine or a previous dose of COVID-19 vaccine? No   Have you ever had an allergic reaction to another vaccine (other thanCOVID-19 vaccine) or an injectable medication? (This would include a severe allergic reaction or a reaction that caused hives, swelling, or respiratory distress, including wheezing.)   No    Do you have a history of any of the following:  Myocarditis or Pericarditis: No  Dermal fillers:  No  Multisystem Inflammatory Syndrome (MIS-C or MIS-A)? No  COVID-19 disease within the past 3 months? No  Vaccinated with monkeypox vaccine in the last 4 weeks? No  Eligible and administered Covid [place] 12y+, [id number], monitored, tolerated well, Given VIS and NCIR, discussed and understood. M.Angalena Cousineau, LPN.

## 2021-11-25 DIAGNOSIS — L821 Other seborrheic keratosis: Secondary | ICD-10-CM | POA: Diagnosis not present

## 2021-11-25 DIAGNOSIS — D485 Neoplasm of uncertain behavior of skin: Secondary | ICD-10-CM | POA: Diagnosis not present

## 2021-11-25 DIAGNOSIS — L578 Other skin changes due to chronic exposure to nonionizing radiation: Secondary | ICD-10-CM | POA: Diagnosis not present

## 2021-11-25 DIAGNOSIS — L920 Granuloma annulare: Secondary | ICD-10-CM | POA: Diagnosis not present

## 2021-11-25 DIAGNOSIS — Z859 Personal history of malignant neoplasm, unspecified: Secondary | ICD-10-CM | POA: Diagnosis not present

## 2022-01-07 ENCOUNTER — Ambulatory Visit
Admission: RE | Admit: 2022-01-07 | Discharge: 2022-01-07 | Disposition: A | Discharge status: Home / Self Care | Payer: PPO | Source: Ambulatory Visit | Attending: Internal Medicine | Admitting: Internal Medicine

## 2022-01-07 DIAGNOSIS — F17210 Nicotine dependence, cigarettes, uncomplicated: Secondary | ICD-10-CM | POA: Diagnosis not present

## 2022-01-07 DIAGNOSIS — Z87891 Personal history of nicotine dependence: Secondary | ICD-10-CM | POA: Insufficient documentation

## 2022-01-13 ENCOUNTER — Other Ambulatory Visit: Payer: Self-pay

## 2022-01-13 DIAGNOSIS — F17210 Nicotine dependence, cigarettes, uncomplicated: Secondary | ICD-10-CM

## 2022-01-13 DIAGNOSIS — Z87891 Personal history of nicotine dependence: Secondary | ICD-10-CM

## 2022-01-13 DIAGNOSIS — Z122 Encounter for screening for malignant neoplasm of respiratory organs: Secondary | ICD-10-CM

## 2022-03-12 DIAGNOSIS — E7800 Pure hypercholesterolemia, unspecified: Secondary | ICD-10-CM | POA: Diagnosis not present

## 2022-03-12 DIAGNOSIS — J431 Panlobular emphysema: Secondary | ICD-10-CM | POA: Diagnosis not present

## 2022-03-12 DIAGNOSIS — I10 Essential (primary) hypertension: Secondary | ICD-10-CM | POA: Diagnosis not present

## 2022-03-12 DIAGNOSIS — I700 Atherosclerosis of aorta: Secondary | ICD-10-CM | POA: Diagnosis not present

## 2022-03-12 DIAGNOSIS — I6523 Occlusion and stenosis of bilateral carotid arteries: Secondary | ICD-10-CM | POA: Diagnosis not present

## 2022-03-12 DIAGNOSIS — I2510 Atherosclerotic heart disease of native coronary artery without angina pectoris: Secondary | ICD-10-CM | POA: Diagnosis not present

## 2022-04-06 DIAGNOSIS — H43813 Vitreous degeneration, bilateral: Secondary | ICD-10-CM | POA: Diagnosis not present

## 2022-04-06 DIAGNOSIS — Z961 Presence of intraocular lens: Secondary | ICD-10-CM | POA: Diagnosis not present

## 2022-04-09 ENCOUNTER — Inpatient Hospital Stay: Payer: PPO | Attending: Oncology | Admitting: Oncology

## 2022-04-09 DIAGNOSIS — C50412 Malignant neoplasm of upper-outer quadrant of left female breast: Secondary | ICD-10-CM | POA: Diagnosis not present

## 2022-04-09 DIAGNOSIS — I2510 Atherosclerotic heart disease of native coronary artery without angina pectoris: Secondary | ICD-10-CM | POA: Diagnosis not present

## 2022-04-09 DIAGNOSIS — E559 Vitamin D deficiency, unspecified: Secondary | ICD-10-CM | POA: Diagnosis not present

## 2022-04-09 DIAGNOSIS — M810 Age-related osteoporosis without current pathological fracture: Secondary | ICD-10-CM | POA: Diagnosis not present

## 2022-04-09 DIAGNOSIS — Z0000 Encounter for general adult medical examination without abnormal findings: Secondary | ICD-10-CM | POA: Diagnosis not present

## 2022-04-09 DIAGNOSIS — I10 Essential (primary) hypertension: Secondary | ICD-10-CM | POA: Diagnosis not present

## 2022-04-09 DIAGNOSIS — Z79811 Long term (current) use of aromatase inhibitors: Secondary | ICD-10-CM

## 2022-04-09 DIAGNOSIS — F5104 Psychophysiologic insomnia: Secondary | ICD-10-CM | POA: Diagnosis not present

## 2022-04-09 DIAGNOSIS — Z79899 Other long term (current) drug therapy: Secondary | ICD-10-CM | POA: Diagnosis not present

## 2022-04-09 DIAGNOSIS — J431 Panlobular emphysema: Secondary | ICD-10-CM | POA: Diagnosis not present

## 2022-04-09 DIAGNOSIS — E7800 Pure hypercholesterolemia, unspecified: Secondary | ICD-10-CM | POA: Diagnosis not present

## 2022-04-09 MED ORDER — TAMOXIFEN CITRATE 20 MG PO TABS: 20.0000 mg | ORAL_TABLET | Freq: Every day | ORAL | 3 refills | Status: DC

## 2022-04-09 NOTE — Progress Notes (Signed)
Patient scheduled for virtual visit today regarding breast cancer. Patient denies concerns, reports she is tolerating tamoxifen well.

## 2022-04-09 NOTE — Progress Notes (Signed)
[place]  Telephone:([phone]) [phone] Fax:(336) [phone]  ID: Kim Cook OB: 06/24/1950  MR#: [id number]  [id number]  Patient Care Team: Idelle Crouch, MD as PCP - General (Internal Medicine)  I connected with Kim Cook on 04/09/22 at  3:30 PM EDT by video enabled telemedicine visit and verified that I am speaking with the correct person using two identifiers.   I discussed the limitations, risks, security and privacy concerns of performing an evaluation and management service by telemedicine and the availability of in-person appointments. I also discussed with the patient that there may be a patient responsible charge related to this service. The patient expressed understanding and agreed to proceed.   Other persons participating in the visit and their role in the encounter: Patient, MD.  Patient's location: Home. Provider's location: Clinic.  CHIEF COMPLAINT: Clinical stage Ia ER/PR positive, HER2 negative invasive carcinoma of the upper outer quadrant left breast.  Oncotype DX score 9, low risk.  INTERVAL HISTORY: Patient agreed to video assisted telemedicine visit for her routine 30-month evaluation.  She continues to feel well and remains asymptomatic.  She continues to tolerate tamoxifen and Fosamax without significant side effects. She has no neurologic complaints.  She denies any recent fevers or illnesses.  She has a good appetite and denies weight loss.  She has no chest pain, shortness of breath, cough, or hemoptysis.  She denies any nausea, vomiting, constipation, or diarrhea.  She has no urinary complaints.  Patient offers no specific complaints today.  REVIEW OF SYSTEMS:   Review of Systems  Constitutional: Negative.  Negative for fever, malaise/fatigue and weight loss.  Respiratory: Negative.  Negative for cough, hemoptysis and shortness of breath.   Cardiovascular: Negative.  Negative for chest pain and leg swelling.   Gastrointestinal: Negative.  Negative for abdominal pain.  Genitourinary: Negative.  Negative for dysuria.  Musculoskeletal: Negative.  Negative for back pain.  Skin: Negative.  Negative for rash.  Neurological: Negative.  Negative for dizziness, focal weakness, weakness and headaches.  Psychiatric/Behavioral: Negative.  The patient is not nervous/anxious.     As per HPI. Otherwise, a complete review of systems is negative.  PAST MEDICAL HISTORY: Past Medical History:  Diagnosis Date   Breast cancer ([place]) 2022   Left Breast   Family history of breast cancer    Family history of lung cancer    GERD (gastroesophageal reflux disease)    High cholesterol    History of kidney stones    Hypertension    Motion sickness    boats, back seats of cars   Osteoporosis    RLS (restless legs syndrome)    Vitamin D deficiency     PAST SURGICAL HISTORY: Past Surgical History:  Procedure Laterality Date   ABDOMINAL HYSTERECTOMY     Partial    BREAST CYST ASPIRATION Left    neg   CATARACT EXTRACTION W/PHACO Left 08/02/2019   Procedure: CATARACT EXTRACTION PHACO AND INTRAOCULAR LENS PLACEMENT ([place]) LEFT;  Surgeon: Leandrew Koyanagi, MD;  Location: [facility];  Service: Ophthalmology;  Laterality: Left;  5.46 0:56.5 9.7%   CATARACT EXTRACTION W/PHACO Right 08/30/2019   Procedure: CATARACT EXTRACTION PHACO AND INTRAOCULAR LENS PLACEMENT ([place]) RIGHT;  Surgeon: Leandrew Koyanagi, MD;  Location: [facility];  Service: Ophthalmology;  Laterality: Right;  5.53 0:51.0 10.8%   COLONOSCOPY     06/15/00; 09/27/07   COLONOSCOPY WITH PROPOFOL N/A 12/06/2017   Procedure: COLONOSCOPY WITH PROPOFOL;  Surgeon: Manya Silvas, MD;  Location: [facility];  Service: Endoscopy;  Laterality: N/A;   FOOT SURGERY  2000   MASTECTOMY Left 2022   SIMPLE MASTECTOMY WITH AXILLARY SENTINEL NODE BIOPSY Left 08/21/2020   Procedure: SIMPLE MASTECTOMY WITH AXILLARY SENTINEL NODE BIOPSY;   Surgeon: Robert Bellow, MD;  Location: [facility];  Service: General;  Laterality: Left;    FAMILY HISTORY: Family History  Problem Relation Age of Onset   Hypertension Mother    Coronary artery disease Mother    Heart attack Mother    Hypertension Father    Skin cancer Father    Breast cancer Maternal Aunt        dx late 79s   Lung cancer Maternal Uncle    Breast cancer Paternal Aunt        dx 40s-50s   Osteoporosis Maternal Grandmother    Breast cancer Paternal Grandmother        dx 20s-30s   Hypertension Son    Breast cancer Cousin 51       paternal side    ADVANCED DIRECTIVES (Y/N):  N  HEALTH MAINTENANCE: Social History   Tobacco Use   Smoking status: Every Day    Packs/day: 1.00    Years: 40.00    Additional pack years: 0.00    Total pack years: 40.00    Types: Cigarettes   Smokeless tobacco: Never  Vaping Use   Vaping Use: Never used  Substance Use Topics   Alcohol use: No   Drug use: No     Colonoscopy:  PAP:  Bone density:  Lipid panel:  No Known Allergies  Current Outpatient Medications  Medication Sig Dispense Refill   alendronate (FOSAMAX) 70 MG tablet Take 70 mg by mouth every Monday. Take with a full glass of water on an empty stomach.     aspirin 81 MG chewable tablet Chew 81 mg by mouth at bedtime.     atorvastatin (LIPITOR) 20 MG tablet Take 20 mg by mouth at bedtime.     bisoprolol-hydrochlorothiazide (ZIAC) 10-6.25 MG tablet Take 1 tablet by mouth daily.     Cholecalciferol 25 MCG (1000 UT) capsule Take 1,000 Units by mouth at bedtime.     MELATONIN PO Take 5 mg by mouth at bedtime.     traZODone (DESYREL) 100 MG tablet Take 100 mg by mouth at bedtime.     zinc gluconate 50 MG tablet Take 50 mg by mouth at bedtime.     tamoxifen (NOLVADEX) 20 MG tablet Take 1 tablet (20 mg total) by mouth daily. 90 tablet 3   No current facility-administered medications for this visit.    OBJECTIVE: There were no vitals filed for this visit.     There is no height or weight on file to calculate BMI.    ECOG FS:0 - Asymptomatic  General: Well-developed, well-nourished, no acute distress. HEENT: Normocephalic. Neuro: Alert, answering all questions appropriately. Cranial nerves grossly intact. Psych: Normal affect.  LAB RESULTS:  No results found for: "NA", "K", "CL", "CO2", "GLUCOSE", "BUN", "CREATININE", "CALCIUM", "PROT", "ALBUMIN", "AST", "ALT", "ALKPHOS", "BILITOT", "GFRNONAA", "GFRAA"  No results found for: "WBC", "NEUTROABS", "HGB", "HCT", "MCV", "PLT"   STUDIES: No results found.  ASSESSMENT: Clinical stage Ia ER/PR positive, HER2 negative invasive carcinoma of the upper outer quadrant left breast.  PLAN:    Clinical stage Ia ER/PR positive, HER2 negative invasive carcinoma of the upper outer quadrant left breast: Patient underwent total mastectomy on August 21, 2020.  Because of this, she did not require  adjuvant XRT.  Oncotype Dx = 9 is low risk therefore chemotherapy was not necessary.  Patient was initially started on letrozole, but then switched to tamoxifen given her underlying osteoporosis.  Continue treatment for total of 5 years completing in August 2027.  Her most recent mammogram on July 23, 2021 was reported as BI-RADS 1.  Repeat in July 2024.  Return to clinic in 6 months with video-assisted telemedicine visit. Osteoporosis: Patient's most recent bone mineral density on October 06, 2021 reported T score of -4.2 which is slightly worse than 1 year prior with a T score was reported -4.0.  Continue tamoxifen and Fosamax as prescribed.  Patient also takes calcium and vitamin D supplementation.  Plan to repeat bone mineral density in September 2024 and if continued decline, will discontinue Fosamax and transition patient to Prolia.  Follow-up 1 to 2 days after her bone mineral density for further evaluation as above.  I provided 20 minutes of face-to-face video visit time during this encounter which included chart  review, counseling, and coordination of care as documented above.     Patient expressed understanding and was in agreement with this plan. She also understands that She can call clinic at any time with any questions, concerns, or complaints.    Cancer Staging  Carcinoma of upper-outer quadrant of female breast, left [facility]) Staging form: Breast, AJCC 8th Edition - Clinical stage from 08/16/2020: Stage IA (cT1b, cN0, cM0, G1, ER+, PR+, HER2-) - Signed by Lloyd Huger, MD on 08/16/2020 Stage prefix: Initial diagnosis Histologic grading system: 3 grade system   Lloyd Huger, MD   04/09/2022 3:55 PM

## 2022-07-27 ENCOUNTER — Ambulatory Visit
Admission: RE | Admit: 2022-07-27 | Discharge: 2022-07-27 | Disposition: A | Discharge status: Home / Self Care | Payer: PPO | Source: Ambulatory Visit | Attending: Oncology | Admitting: Oncology

## 2022-07-27 DIAGNOSIS — Z1231 Encounter for screening mammogram for malignant neoplasm of breast: Secondary | ICD-10-CM | POA: Insufficient documentation

## 2022-07-27 DIAGNOSIS — Z853 Personal history of malignant neoplasm of breast: Secondary | ICD-10-CM | POA: Diagnosis not present

## 2022-07-27 DIAGNOSIS — C50412 Malignant neoplasm of upper-outer quadrant of left female breast: Secondary | ICD-10-CM | POA: Insufficient documentation

## 2022-07-28 ENCOUNTER — Other Ambulatory Visit: Payer: Self-pay | Admitting: Oncology

## 2022-07-28 ENCOUNTER — Telehealth: Payer: Self-pay | Admitting: *Deleted

## 2022-07-28 DIAGNOSIS — R928 Other abnormal and inconclusive findings on diagnostic imaging of breast: Secondary | ICD-10-CM

## 2022-07-28 DIAGNOSIS — N63 Unspecified lump in unspecified breast: Secondary | ICD-10-CM

## 2022-07-28 NOTE — Telephone Encounter (Signed)
Patient called stating that she sees the results of her mammogram and is asking what she needs to do now an dif she needs to schedule something  Narrative & Impression  CLINICAL DATA:  Screening. History of LEFT breast cancer in 2022 status post mastectomy.   EXAM: DIGITAL SCREENING UNILATERAL RIGHT MAMMOGRAM WITH CAD AND TOMOSYNTHESIS   TECHNIQUE: Right screening digital craniocaudal and mediolateral oblique mammograms were obtained. Right screening digital breast tomosynthesis was performed. The images were evaluated with computer-aided detection.   COMPARISON:  Previous exam(s).   ACR Breast Density Category c: The breasts are heterogeneously dense, which may obscure small masses.   FINDINGS: In the right breast, a possible mass warrants further evaluation. This possible mass is seen within the upper RIGHT breast, MLO view only, slice 31.   IMPRESSION: Further evaluation is suggested for a possible mass in the right breast.   RECOMMENDATION: Diagnostic mammogram and possibly ultrasound of the right breast. (Code:[id number]-66M)   The patient will be contacted regarding the findings, and additional imaging will be scheduled.   BI-RADS CATEGORY  0: Incomplete: Need additional imaging evaluation.     Electronically Signed   By: Bary Richard M.D.   On: 07/28/2022 13:07

## 2022-07-30 ENCOUNTER — Ambulatory Visit
Admission: RE | Admit: 2022-07-30 | Discharge: 2022-07-30 | Disposition: A | Discharge status: Home / Self Care | Payer: PPO | Source: Ambulatory Visit | Attending: Oncology | Admitting: Oncology

## 2022-07-30 DIAGNOSIS — R928 Other abnormal and inconclusive findings on diagnostic imaging of breast: Secondary | ICD-10-CM | POA: Diagnosis not present

## 2022-07-30 DIAGNOSIS — N63 Unspecified lump in unspecified breast: Secondary | ICD-10-CM | POA: Insufficient documentation

## 2022-07-30 DIAGNOSIS — R92331 Mammographic heterogeneous density, right breast: Secondary | ICD-10-CM | POA: Diagnosis not present

## 2022-08-03 DIAGNOSIS — C50412 Malignant neoplasm of upper-outer quadrant of left female breast: Secondary | ICD-10-CM | POA: Diagnosis not present

## 2022-10-12 ENCOUNTER — Ambulatory Visit
Admission: RE | Admit: 2022-10-12 | Discharge: 2022-10-12 | Disposition: A | Discharge status: Home / Self Care | Payer: PPO | Source: Ambulatory Visit | Attending: Oncology | Admitting: Oncology

## 2022-10-12 DIAGNOSIS — Z79811 Long term (current) use of aromatase inhibitors: Secondary | ICD-10-CM | POA: Insufficient documentation

## 2022-10-12 DIAGNOSIS — Z780 Asymptomatic menopausal state: Secondary | ICD-10-CM | POA: Diagnosis not present

## 2022-10-12 DIAGNOSIS — Z13820 Encounter for screening for osteoporosis: Secondary | ICD-10-CM | POA: Diagnosis not present

## 2022-10-12 DIAGNOSIS — Z853 Personal history of malignant neoplasm of breast: Secondary | ICD-10-CM | POA: Insufficient documentation

## 2022-10-12 DIAGNOSIS — Z923 Personal history of irradiation: Secondary | ICD-10-CM | POA: Diagnosis not present

## 2022-10-12 DIAGNOSIS — M810 Age-related osteoporosis without current pathological fracture: Secondary | ICD-10-CM | POA: Diagnosis not present

## 2022-10-13 DIAGNOSIS — L570 Actinic keratosis: Secondary | ICD-10-CM | POA: Diagnosis not present

## 2022-10-13 DIAGNOSIS — Z859 Personal history of malignant neoplasm, unspecified: Secondary | ICD-10-CM | POA: Diagnosis not present

## 2022-10-13 DIAGNOSIS — L578 Other skin changes due to chronic exposure to nonionizing radiation: Secondary | ICD-10-CM | POA: Diagnosis not present

## 2022-10-15 ENCOUNTER — Inpatient Hospital Stay: Payer: PPO | Attending: Oncology | Admitting: Oncology

## 2022-10-15 DIAGNOSIS — Z170 Estrogen receptor positive status [ER+]: Secondary | ICD-10-CM | POA: Diagnosis not present

## 2022-10-15 DIAGNOSIS — C50412 Malignant neoplasm of upper-outer quadrant of left female breast: Secondary | ICD-10-CM

## 2022-10-15 DIAGNOSIS — Z79810 Long term (current) use of selective estrogen receptor modulators (SERMs): Secondary | ICD-10-CM

## 2022-10-15 MED ORDER — ALENDRONATE SODIUM 70 MG PO TABS: 70.0000 mg | ORAL_TABLET | ORAL | 3 refills | Status: DC

## 2022-10-15 MED ORDER — TAMOXIFEN CITRATE 20 MG PO TABS: 20.0000 mg | ORAL_TABLET | Freq: Every day | ORAL | 3 refills | Status: DC

## 2022-10-15 NOTE — Progress Notes (Signed)
[facility]  [phone] [phone]) [phone]  ID: Kim Cook OB: December 11, 1950  MR#: [id number]  HQI#:[id number]  Patient Care Team: Marguarite Arbour, MD as PCP - General (Internal Medicine)  I connected with Kim Cook on 10/15/22 at  3:30 PM EDT by video enabled telemedicine visit and verified that I am speaking with the correct person using two identifiers.   I discussed the limitations, risks, security and privacy concerns of performing an evaluation and management service by telemedicine and the availability of in-person appointments. I also discussed with the patient that there may be a patient responsible charge related to this service. The patient expressed understanding and agreed to proceed.   Other persons participating in the visit and their role in the encounter: Patient, MD.  Patient's location: Home. Provider's location: Clinic.  CHIEF COMPLAINT: Clinical stage Ia ER/PR positive, HER2 negative invasive carcinoma of the upper outer quadrant left breast.  Oncotype DX score 9, low risk.  INTERVAL HISTORY: Patient agreed to video assisted telemedicine visit for her routine 52-month evaluation.  She continues to feel well and remains asymptomatic.  She is tolerating tamoxifen and Fosamax without significant side effects. She has no neurologic complaints.  She denies any recent fevers or illnesses.  She has a good appetite and denies weight loss.  She has no chest pain, shortness of breath, cough, or hemoptysis.  She denies any nausea, vomiting, constipation, or diarrhea.  She has no urinary complaints.  Patient offers no specific complaints today.  REVIEW OF SYSTEMS:   Review of Systems  Constitutional: Negative.  Negative for fever, malaise/fatigue and weight loss.  Respiratory: Negative.  Negative for cough, hemoptysis and shortness of breath.   Cardiovascular: Negative.  Negative for chest pain and leg swelling.  Gastrointestinal:  Negative.  Negative for abdominal pain.  Genitourinary: Negative.  Negative for dysuria.  Musculoskeletal: Negative.  Negative for back pain.  Skin: Negative.  Negative for rash.  Neurological: Negative.  Negative for dizziness, focal weakness, weakness and headaches.  Psychiatric/Behavioral: Negative.  The patient is not nervous/anxious.     As per HPI. Otherwise, a complete review of systems is negative.  PAST MEDICAL HISTORY: Past Medical History:  Diagnosis Date   Breast cancer (HCC) 2022   Left Breast   Family history of breast cancer    Family history of lung cancer    GERD (gastroesophageal reflux disease)    High cholesterol    History of kidney stones    Hypertension    Motion sickness    boats, back seats of cars   Osteoporosis    RLS (restless legs syndrome)    Vitamin D deficiency     PAST SURGICAL HISTORY: Past Surgical History:  Procedure Laterality Date   ABDOMINAL HYSTERECTOMY     Partial    BREAST CYST ASPIRATION Left    neg   CATARACT EXTRACTION W/PHACO Left 08/02/2019   Procedure: CATARACT EXTRACTION PHACO AND INTRAOCULAR LENS PLACEMENT (IOC) LEFT;  Surgeon: Lockie Mola, MD;  Location: [facility];  Service: Ophthalmology;  Laterality: Left;  5.46 0:56.5 9.7%   CATARACT EXTRACTION W/PHACO Right 08/30/2019   Procedure: CATARACT EXTRACTION PHACO AND INTRAOCULAR LENS PLACEMENT (IOC) RIGHT;  Surgeon: Lockie Mola, MD;  Location: [facility];  Service: Ophthalmology;  Laterality: Right;  5.53 0:51.0 10.8%   COLONOSCOPY     06/15/00; 09/27/07   COLONOSCOPY WITH PROPOFOL N/A 12/06/2017   Procedure: COLONOSCOPY WITH PROPOFOL;  Surgeon: Scot Jun, MD;  Location:  S Patient ID: [id number] Birth Date: [birth year] Height: 64.5 in. Gender: Female Exam Date: 10/12/2022 Weight: 131.6 lbs. Indications: Advanced Age, Caucasian, COPD, Height Loss, High Risk Meds, History of Breast Cancer, History of Radiation, Hysterectomy, Osteoporotic, Postmenopausal, Tobacco User Fractures: Ribs Treatments: Calcium, Fosamax, Tamoxifen, Vitamin D DENSITOMETRY RESULTS: Site         Region     Measured Date Measured Age WHO Classification Young Adult T-score BMD         %Change vs. Previous Significant Change (*) DualFemur Total Left 10/12/2022 73.6 Osteopenia -2.0 0.757 g/cm2 -1.9% - DualFemur Total Left 10/06/2021 72.6 Osteopenia -1.9 0.772 g/cm2 2.1% - DualFemur Total Left 10/02/2020 71.6 Osteopenia -2.0 0.756 g/cm2 -17.9% Yes DualFemur Total Left 11/21/2009 60.7 Normal -0.7 0.921 g/cm2 - - DualFemur Total Mean 10/12/2022 73.6 Osteopenia -1.9 0.769 g/cm2 -2.9% Yes DualFemur Total Mean 10/06/2021 72.6 Osteopenia -1.7 0.792 g/cm2 -0.8% -  DualFemur Total Mean 10/02/2020 71.6 Osteopenia -1.7 0.798 g/cm2 -15.2% Yes DualFemur Total Mean 11/21/2009 60.7 Normal -0.5 0.941 g/cm2 - - Left Forearm Radius 33% 10/12/2022 73.6 Osteoporosis -4.0 0.526 g/cm2 4.4% - Left Forearm Radius 33% 10/06/2021 72.6 Osteoporosis -4.2 0.504 g/cm2 -4.7% - Left Forearm Radius 33% 10/02/2020 71.6 Osteoporosis -4.0 0.529 g/cm2 -33.2% Yes Left Forearm Radius 33% 11/21/2009 60.7 Normal -1.0 0.792 g/cm2 - - ASSESSMENT: The BMD measured at Forearm Radius 33% is 0.526 g/cm2 with a T-score of -4.0. This patient is considered osteoporotic according to World Health Organization [facility]) criteria. The scan quality is good. Compared with prior study, there has been a significant decrease in the total hip. Lumbar spine was not utilized due to advanced degenerative changes. World Science writer [facility]) criteria for post-menopausal, Caucasian Women: Normal:                   T-score at or above -1 SD Osteopenia/low bone mass: T-score between -1 and -2.5 SD Osteoporosis:             T-score at or below -2.5 SD RECOMMENDATIONS: 1. All patients should optimize calcium and vitamin D intake. 2. Consider FDA-approved medical therapies in postmenopausal women and men aged 2 years and older, based on the following: a. A hip or vertebral(clinical or morphometric) fracture b. T-score < -2.5 at the femoral neck or spine after appropriate evaluation to exclude secondary causes c. Low bone mass (T-score between -1.0 and -2.5 at the femoral neck or spine) and a 10-year probability of a hip fracture > 3% or a 10-year probability of a major osteoporosis-related fracture > 20% based on the [place]-adapted WHO algorithm 3. Clinician judgment and/or patient preferences may indicate treatment for people with 10-year fracture probabilities above or below these levels FOLLOW-UP: People with diagnosed cases of osteoporosis or at high risk for fracture should have regular bone mineral density tests. For patients  eligible for Medicare, routine testing is allowed once every 2 years. The testing frequency can be increased to one year for patients who have rapidly progressing disease, those who are receiving or discontinuing medical therapy to restore bone mass, or have additional risk factors. I have reviewed this report, and agree with the above findings. [facility] Electronically Signed   By: Frederico Hamman M.D.   On: 10/12/2022 11:23    ASSESSMENT: Clinical stage Ia ER/PR positive, HER2 negative invasive carcinoma of the upper outer quadrant left breast.  PLAN:    Clinical stage Ia ER/PR positive, HER2 negative invasive carcinoma of the upper outer quadrant left breast: Patient underwent total  S Patient ID: [id number] Birth Date: [birth year] Height: 64.5 in. Gender: Female Exam Date: 10/12/2022 Weight: 131.6 lbs. Indications: Advanced Age, Caucasian, COPD, Height Loss, High Risk Meds, History of Breast Cancer, History of Radiation, Hysterectomy, Osteoporotic, Postmenopausal, Tobacco User Fractures: Ribs Treatments: Calcium, Fosamax, Tamoxifen, Vitamin D DENSITOMETRY RESULTS: Site         Region     Measured Date Measured Age WHO Classification Young Adult T-score BMD         %Change vs. Previous Significant Change (*) DualFemur Total Left 10/12/2022 73.6 Osteopenia -2.0 0.757 g/cm2 -1.9% - DualFemur Total Left 10/06/2021 72.6 Osteopenia -1.9 0.772 g/cm2 2.1% - DualFemur Total Left 10/02/2020 71.6 Osteopenia -2.0 0.756 g/cm2 -17.9% Yes DualFemur Total Left 11/21/2009 60.7 Normal -0.7 0.921 g/cm2 - - DualFemur Total Mean 10/12/2022 73.6 Osteopenia -1.9 0.769 g/cm2 -2.9% Yes DualFemur Total Mean 10/06/2021 72.6 Osteopenia -1.7 0.792 g/cm2 -0.8% -  DualFemur Total Mean 10/02/2020 71.6 Osteopenia -1.7 0.798 g/cm2 -15.2% Yes DualFemur Total Mean 11/21/2009 60.7 Normal -0.5 0.941 g/cm2 - - Left Forearm Radius 33% 10/12/2022 73.6 Osteoporosis -4.0 0.526 g/cm2 4.4% - Left Forearm Radius 33% 10/06/2021 72.6 Osteoporosis -4.2 0.504 g/cm2 -4.7% - Left Forearm Radius 33% 10/02/2020 71.6 Osteoporosis -4.0 0.529 g/cm2 -33.2% Yes Left Forearm Radius 33% 11/21/2009 60.7 Normal -1.0 0.792 g/cm2 - - ASSESSMENT: The BMD measured at Forearm Radius 33% is 0.526 g/cm2 with a T-score of -4.0. This patient is considered osteoporotic according to World Health Organization [facility]) criteria. The scan quality is good. Compared with prior study, there has been a significant decrease in the total hip. Lumbar spine was not utilized due to advanced degenerative changes. World Science writer [facility]) criteria for post-menopausal, Caucasian Women: Normal:                   T-score at or above -1 SD Osteopenia/low bone mass: T-score between -1 and -2.5 SD Osteoporosis:             T-score at or below -2.5 SD RECOMMENDATIONS: 1. All patients should optimize calcium and vitamin D intake. 2. Consider FDA-approved medical therapies in postmenopausal women and men aged 2 years and older, based on the following: a. A hip or vertebral(clinical or morphometric) fracture b. T-score < -2.5 at the femoral neck or spine after appropriate evaluation to exclude secondary causes c. Low bone mass (T-score between -1.0 and -2.5 at the femoral neck or spine) and a 10-year probability of a hip fracture > 3% or a 10-year probability of a major osteoporosis-related fracture > 20% based on the [place]-adapted WHO algorithm 3. Clinician judgment and/or patient preferences may indicate treatment for people with 10-year fracture probabilities above or below these levels FOLLOW-UP: People with diagnosed cases of osteoporosis or at high risk for fracture should have regular bone mineral density tests. For patients  eligible for Medicare, routine testing is allowed once every 2 years. The testing frequency can be increased to one year for patients who have rapidly progressing disease, those who are receiving or discontinuing medical therapy to restore bone mass, or have additional risk factors. I have reviewed this report, and agree with the above findings. [facility] Electronically Signed   By: Frederico Hamman M.D.   On: 10/12/2022 11:23    ASSESSMENT: Clinical stage Ia ER/PR positive, HER2 negative invasive carcinoma of the upper outer quadrant left breast.  PLAN:    Clinical stage Ia ER/PR positive, HER2 negative invasive carcinoma of the upper outer quadrant left breast: Patient underwent total  S Patient ID: [id number] Birth Date: [birth year] Height: 64.5 in. Gender: Female Exam Date: 10/12/2022 Weight: 131.6 lbs. Indications: Advanced Age, Caucasian, COPD, Height Loss, High Risk Meds, History of Breast Cancer, History of Radiation, Hysterectomy, Osteoporotic, Postmenopausal, Tobacco User Fractures: Ribs Treatments: Calcium, Fosamax, Tamoxifen, Vitamin D DENSITOMETRY RESULTS: Site         Region     Measured Date Measured Age WHO Classification Young Adult T-score BMD         %Change vs. Previous Significant Change (*) DualFemur Total Left 10/12/2022 73.6 Osteopenia -2.0 0.757 g/cm2 -1.9% - DualFemur Total Left 10/06/2021 72.6 Osteopenia -1.9 0.772 g/cm2 2.1% - DualFemur Total Left 10/02/2020 71.6 Osteopenia -2.0 0.756 g/cm2 -17.9% Yes DualFemur Total Left 11/21/2009 60.7 Normal -0.7 0.921 g/cm2 - - DualFemur Total Mean 10/12/2022 73.6 Osteopenia -1.9 0.769 g/cm2 -2.9% Yes DualFemur Total Mean 10/06/2021 72.6 Osteopenia -1.7 0.792 g/cm2 -0.8% -  DualFemur Total Mean 10/02/2020 71.6 Osteopenia -1.7 0.798 g/cm2 -15.2% Yes DualFemur Total Mean 11/21/2009 60.7 Normal -0.5 0.941 g/cm2 - - Left Forearm Radius 33% 10/12/2022 73.6 Osteoporosis -4.0 0.526 g/cm2 4.4% - Left Forearm Radius 33% 10/06/2021 72.6 Osteoporosis -4.2 0.504 g/cm2 -4.7% - Left Forearm Radius 33% 10/02/2020 71.6 Osteoporosis -4.0 0.529 g/cm2 -33.2% Yes Left Forearm Radius 33% 11/21/2009 60.7 Normal -1.0 0.792 g/cm2 - - ASSESSMENT: The BMD measured at Forearm Radius 33% is 0.526 g/cm2 with a T-score of -4.0. This patient is considered osteoporotic according to World Health Organization [facility]) criteria. The scan quality is good. Compared with prior study, there has been a significant decrease in the total hip. Lumbar spine was not utilized due to advanced degenerative changes. World Science writer [facility]) criteria for post-menopausal, Caucasian Women: Normal:                   T-score at or above -1 SD Osteopenia/low bone mass: T-score between -1 and -2.5 SD Osteoporosis:             T-score at or below -2.5 SD RECOMMENDATIONS: 1. All patients should optimize calcium and vitamin D intake. 2. Consider FDA-approved medical therapies in postmenopausal women and men aged 2 years and older, based on the following: a. A hip or vertebral(clinical or morphometric) fracture b. T-score < -2.5 at the femoral neck or spine after appropriate evaluation to exclude secondary causes c. Low bone mass (T-score between -1.0 and -2.5 at the femoral neck or spine) and a 10-year probability of a hip fracture > 3% or a 10-year probability of a major osteoporosis-related fracture > 20% based on the [place]-adapted WHO algorithm 3. Clinician judgment and/or patient preferences may indicate treatment for people with 10-year fracture probabilities above or below these levels FOLLOW-UP: People with diagnosed cases of osteoporosis or at high risk for fracture should have regular bone mineral density tests. For patients  eligible for Medicare, routine testing is allowed once every 2 years. The testing frequency can be increased to one year for patients who have rapidly progressing disease, those who are receiving or discontinuing medical therapy to restore bone mass, or have additional risk factors. I have reviewed this report, and agree with the above findings. [facility] Electronically Signed   By: Frederico Hamman M.D.   On: 10/12/2022 11:23    ASSESSMENT: Clinical stage Ia ER/PR positive, HER2 negative invasive carcinoma of the upper outer quadrant left breast.  PLAN:    Clinical stage Ia ER/PR positive, HER2 negative invasive carcinoma of the upper outer quadrant left breast: Patient underwent total

## 2023-01-12 ENCOUNTER — Ambulatory Visit
Admission: RE | Admit: 2023-01-12 | Discharge: 2023-01-12 | Disposition: A | Discharge status: Home / Self Care | Payer: PPO | Source: Ambulatory Visit | Attending: Internal Medicine | Admitting: Internal Medicine

## 2023-01-12 DIAGNOSIS — Z122 Encounter for screening for malignant neoplasm of respiratory organs: Secondary | ICD-10-CM | POA: Diagnosis not present

## 2023-01-12 DIAGNOSIS — Z87891 Personal history of nicotine dependence: Secondary | ICD-10-CM | POA: Diagnosis not present

## 2023-01-12 DIAGNOSIS — F17210 Nicotine dependence, cigarettes, uncomplicated: Secondary | ICD-10-CM | POA: Insufficient documentation

## 2023-01-25 ENCOUNTER — Other Ambulatory Visit: Payer: Self-pay | Admitting: Acute Care

## 2023-01-25 DIAGNOSIS — F17210 Nicotine dependence, cigarettes, uncomplicated: Secondary | ICD-10-CM

## 2023-01-25 DIAGNOSIS — Z87891 Personal history of nicotine dependence: Secondary | ICD-10-CM

## 2023-01-25 DIAGNOSIS — Z122 Encounter for screening for malignant neoplasm of respiratory organs: Secondary | ICD-10-CM

## 2023-02-11 ENCOUNTER — Ambulatory Visit: Payer: PPO

## 2023-02-11 ENCOUNTER — Ambulatory Visit
Admission: RE | Admit: 2023-02-11 | Discharge: 2023-02-11 | Disposition: A | Discharge status: Home / Self Care | Payer: PPO | Source: Ambulatory Visit | Attending: Family Medicine | Admitting: Family Medicine

## 2023-02-11 VITALS — BP 140/81 | HR 86 | Temp 99.4°F | Resp 18

## 2023-02-11 DIAGNOSIS — R059 Cough, unspecified: Secondary | ICD-10-CM | POA: Diagnosis not present

## 2023-02-11 DIAGNOSIS — J101 Influenza due to other identified influenza virus with other respiratory manifestations: Secondary | ICD-10-CM | POA: Insufficient documentation

## 2023-02-11 DIAGNOSIS — R918 Other nonspecific abnormal finding of lung field: Secondary | ICD-10-CM | POA: Diagnosis not present

## 2023-02-11 LAB — RESP PANEL BY RT-PCR (RSV, FLU A&B, COVID)  RVPGX2
Influenza A by PCR: POSITIVE — AB
Influenza B by PCR: NEGATIVE
Resp Syncytial Virus by PCR: NEGATIVE
SARS Coronavirus 2 by RT PCR: NEGATIVE

## 2023-02-11 MED ORDER — OSELTAMIVIR PHOSPHATE 75 MG PO CAPS: 75.0000 mg | ORAL_CAPSULE | Freq: Two times a day (BID) | ORAL | 0 refills | Status: DC

## 2023-02-11 NOTE — ED Provider Notes (Signed)
[facility]    CSN: [id number] Arrival date & time: 02/11/23  1138      History   Chief Complaint Chief Complaint  Patient presents with   Fever    Totally congested and constant cough. Ache all over. - Entered by patient   Cough   Sore Throat    HPI Kim Cook is a 73 y.o. female.   HPI  History obtained from the patient. Kim Cook presents for cough, body aches, diarrhea, fever, chest congestion, sore throat, rhinorrhea, diarrhea for the past 3-4 days.  She has been in bed for the past few days.  Started taking Zpac. All the people on the row at the church function have been sick with similar sx.     Past Medical History:  Diagnosis Date   Breast cancer (HCC) 2022   Left Breast   Family history of breast cancer    Family history of lung cancer    GERD (gastroesophageal reflux disease)    High cholesterol    History of kidney stones    Hypertension    Motion sickness    boats, back seats of cars   Osteoporosis    RLS (restless legs syndrome)    Vitamin D deficiency     Patient Active Problem List   Diagnosis Date Noted   Genetic testing 11/04/2020   Family history of breast cancer 10/17/2020   Family history of lung cancer 10/17/2020   Carcinoma of upper-outer quadrant of female breast, left (HCC) 08/16/2020    Past Surgical History:  Procedure Laterality Date   ABDOMINAL HYSTERECTOMY     Partial    BREAST CYST ASPIRATION Left    neg   CATARACT EXTRACTION W/PHACO Left 08/02/2019   Procedure: CATARACT EXTRACTION PHACO AND INTRAOCULAR LENS PLACEMENT (IOC) LEFT;  Surgeon: Kim Mola, MD;  Location: [facility];  Service: Ophthalmology;  Laterality: Left;  5.46 0:56.5 9.7%   CATARACT EXTRACTION W/PHACO Right 08/30/2019   Procedure: CATARACT EXTRACTION PHACO AND INTRAOCULAR LENS PLACEMENT (IOC) RIGHT;  Surgeon: Kim Mola, MD;  Location: [facility];  Service: Ophthalmology;  Laterality: Right;   5.53 0:51.0 10.8%   COLONOSCOPY     06/15/00; 09/27/07   COLONOSCOPY WITH PROPOFOL N/A 12/06/2017   Procedure: COLONOSCOPY WITH PROPOFOL;  Surgeon: Kim Jun, MD;  Location: [facility];  Service: Endoscopy;  Laterality: N/A;   FOOT SURGERY  2000   MASTECTOMY Left 2022   SIMPLE MASTECTOMY WITH AXILLARY SENTINEL NODE BIOPSY Left 08/21/2020   Procedure: SIMPLE MASTECTOMY WITH AXILLARY SENTINEL NODE BIOPSY;  Surgeon: Kim Mayotte, MD;  Location: [facility] OR[facility];  Service: General;  Laterality: Left;    OB History   No obstetric history on file.      Home Medications    Prior to Admission medications   Medication Sig Start Date End Date Taking? Authorizing Provider  oseltamivir (TAMIFLU) 75 MG capsule Take 1 capsule (75 mg total) by mouth every 12 (twelve) hours. 02/11/23  Yes Kim Cook, Kim Meth, DO  alendronate (FOSAMAX) 70 MG tablet Take 1 tablet (70 mg total) by mouth every Monday. Take with a full glass of water on an empty stomach. 10/19/22   Kim Ruths, MD  aspirin 81 MG chewable tablet Chew 81 mg by mouth at bedtime.    [provider]  atorvastatin (LIPITOR) 20 MG tablet Take 20 mg by mouth at bedtime.    [provider]  bisoprolol-hydrochlorothiazide (ZIAC) 10-6.25 MG tablet Take 1 tablet by mouth daily.  [provider]  Cholecalciferol 25 MCG (1000 UT) capsule Take 1,000 Units by mouth at bedtime.    [provider]  MELATONIN PO Take 5 mg by mouth at bedtime.    [provider]  tamoxifen (NOLVADEX) 20 MG tablet Take 1 tablet (20 mg total) by mouth daily. 10/15/22   Kim Ruths, MD  traZODone (DESYREL) 100 MG tablet Take 100 mg by mouth at bedtime.    [provider]  zinc gluconate 50 MG tablet Take 50 mg by mouth at bedtime.    [provider]    Family History Family History  Problem Relation Age of Onset   Hypertension Mother    Coronary artery disease Mother    Heart attack  Mother    Hypertension Father    Skin cancer Father    Breast cancer Maternal Aunt        dx late 72s   Lung cancer Maternal Uncle    Breast cancer Paternal Aunt        dx 8s-50s   Osteoporosis Maternal Grandmother    Breast cancer Paternal Grandmother        dx 20s-30s   Hypertension Son    Breast cancer Cousin 40       paternal side    Social History Social History   Tobacco Use   Smoking status: Every Day    Current packs/day: 1.00    Average packs/day: 1 pack/day for 40.0 years (40.0 ttl pk-yrs)    Types: Cigarettes   Smokeless tobacco: Never  Vaping Use   Vaping status: Never Used  Substance Use Topics   Alcohol use: No   Drug use: No     Allergies   Patient has no known allergies.   Review of Systems Review of Systems: negative unless otherwise stated in HPI.      Physical Exam Triage Vital Signs ED Triage Vitals  Encounter Vitals Group     BP 02/11/23 1200 (!) 140/81     Systolic BP Percentile --      Diastolic BP Percentile --      Pulse Rate 02/11/23 1200 86     Resp 02/11/23 1200 18     Temp 02/11/23 1200 99.4 F (37.4 C)     Temp Source 02/11/23 1200 Oral     SpO2 02/11/23 1200 94 %     Weight --      Height --      Head Circumference --      Peak Flow --      Pain Score 02/11/23 1159 0     Pain Loc --      Pain Education --      Exclude from Growth Chart --    No data found.  Updated Vital Signs BP (!) 140/81 (BP Location: Right Arm)   Pulse 86   Temp 99.4 F (37.4 C) (Oral)   Resp 18   SpO2 94%   Visual Acuity Right Eye Distance:   Left Eye Distance:   Bilateral Distance:    Right Eye Near:   Left Eye Near:    Bilateral Near:     Physical Exam GEN:     alert, ill but non-toxic appearing female in no distress    HENT:  mucus membranes moist, oropharyngeal without lesions or erythema, no tonsillar hypertrophy or exudates, clear nasal discharge EYES:   no scleral injection or discharge RESP:  no increased work of  breathing, scattered rhonchi  CVS:  regular rate and rhythm Skin:   warm and dry    UC Treatments / Results  Labs (all labs ordered are listed, but only abnormal results are displayed) Labs Reviewed  RESP PANEL BY RT-PCR (RSV, FLU A&B, COVID)  RVPGX2 - Abnormal; Notable for the following components:      Result Value   Influenza A by PCR POSITIVE (*)    All other components within normal limits    EKG   Radiology DG Chest 2 View Result Date: 02/11/2023 CLINICAL DATA:  Cough. EXAM: CHEST - 2 VIEW COMPARISON:  September 11, 2017. FINDINGS: The heart size and mediastinal contours are within normal limits. Hyperexpansion of the lungs is noted. Biapical scarring is noted. No acute pulmonary disease noted. The visualized skeletal structures are unremarkable. IMPRESSION: No active cardiopulmonary disease. Hyperexpansion of the lungs suggesting COPD. Electronically Signed   By: Lupita Raider M.D.   On: 02/11/2023 12:53    Procedures Procedures (including critical care time)  Medications Ordered in UC Medications - No data to display  Initial Impression / Assessment and Plan / UC Course  I have reviewed the triage vital signs and the nursing notes.  Pertinent labs & imaging results that were available during my care of the patient were reviewed by me and considered in my medical decision making (see chart for details).       Pt is a 73 y.o. female who presents for 4 days of respiratory symptoms. [place]y is afebrile here. Satting 94% on room air. Overall pt is ill but non-toxic appearing, well hydrated, without respiratory distress. Pulmonary exam is remarkable scattered rhonchi.  COVID and influenza panel obtained  Chest xray personally reviewed by me without focal pneumonia, pleural effusion, cardiomegaly or pneumothorax. Radiologist impression reviewed.   She is  influenza A positive. Tamiflu prescribed. Pt advised to stop taking the Azithromycin as this is viral illness.   Explained lack of efficacy of antibiotics in viral disease.  Typical duration of symptoms discussed. Discussed symptomatic treatment.   Return and ED precautions given and voiced understanding. Discussed MDM, treatment plan and plan for follow-up with patient who agrees with plan.     Final Clinical Impressions(s) / UC Diagnoses   Final diagnoses:  Influenza A     Discharge Instructions      You have influenza A.  Tamiflu was prescribed.  Your symptoms will gradually improve over the next 7 to 10 days.  The cough may last about 3 weeks.   If you can take ibuprofen 600 mg and/or Tylenol 1000 mg for fever, headache or body aches.    For cough: You can also use guaifenesin and dextromethorphan for cough. You can use a humidifier for chest congestion and cough.  If you don't have a humidifier, you can sit in the bathroom with the hot shower running.      For sore throat: try warm salt water gargles, Mucinex sore throat cough drops or cepacol lozenges, throat spray, warm tea or water with lemon/honey, popsicles or ice, or OTC cold relief medicine for throat discomfort. You can also purchase chloraseptic spray at the pharmacy or dollar store.   For congestion: take a daily anti-histamine like Zyrtec, Claritin, and a oral decongestant, such as pseudoephedrine.  You can also use Flonase 1-2 sprays in each nostril daily. Afrin is also a good option, if you do not have high blood pressure.    It is important to stay hydrated: drink plenty of fluids (water, gatorade/powerade/pedialyte, juices, or  teas) to keep your throat moisturized and help further relieve irritation/discomfort.    Return or go to the Emergency Department if symptoms worsen or do not improve in the next few days      ED Prescriptions     Medication Sig Dispense Auth. Provider   oseltamivir (TAMIFLU) 75 MG capsule Take 1 capsule (75 mg total) by mouth every 12 (twelve) hours. 10 capsule Katha Cabal, DO      PDMP  not reviewed this encounter.   Katha Cabal, DO 02/11/23 1610

## 2023-02-11 NOTE — ED Triage Notes (Addendum)
Pt c/o cough, chest tightness, sore throat, runny nose, and diarrhea x 4 days. Pt started on an old z-pack that she had.

## 2023-02-11 NOTE — Discharge Instructions (Signed)
You have influenza A.  Tamiflu was prescribed.  Your symptoms will gradually improve over the next 7 to 10 days.  The cough may last about 3 weeks.   If you can take ibuprofen 600 mg and/or Tylenol 1000 mg for fever, headache or body aches.    For cough: You can also use guaifenesin and dextromethorphan for cough. You can use a humidifier for chest congestion and cough.  If you don't have a humidifier, you can sit in the bathroom with the hot shower running.      For sore throat: try warm salt water gargles, Mucinex sore throat cough drops or cepacol lozenges, throat spray, warm tea or water with lemon/honey, popsicles or ice, or OTC cold relief medicine for throat discomfort. You can also purchase chloraseptic spray at the pharmacy or dollar store.   For congestion: take a daily anti-histamine like Zyrtec, Claritin, and a oral decongestant, such as pseudoephedrine.  You can also use Flonase 1-2 sprays in each nostril daily. Afrin is also a good option, if you do not have high blood pressure.    It is important to stay hydrated: drink plenty of fluids (water, gatorade/powerade/pedialyte, juices, or teas) to keep your throat moisturized and help further relieve irritation/discomfort.    Return or go to the Emergency Department if symptoms worsen or do not improve in the next few days

## 2023-02-17 ENCOUNTER — Telehealth: Payer: PPO | Admitting: Physician Assistant

## 2023-02-17 DIAGNOSIS — J208 Acute bronchitis due to other specified organisms: Secondary | ICD-10-CM | POA: Diagnosis not present

## 2023-02-17 MED ORDER — BENZONATATE 100 MG PO CAPS
100.0000 mg | ORAL_CAPSULE | Freq: Three times a day (TID) | ORAL | 0 refills | Status: DC | PRN
Start: 2023-02-17 — End: 2023-10-22

## 2023-02-17 MED ORDER — ALBUTEROL SULFATE HFA 108 (90 BASE) MCG/ACT IN AERS
1.0000 | INHALATION_SPRAY | Freq: Four times a day (QID) | RESPIRATORY_TRACT | 0 refills | Status: DC | PRN
Start: 2023-02-17 — End: 2023-10-22

## 2023-02-17 NOTE — Progress Notes (Signed)
 E-Visit for Cough   We are sorry that you are not feeling well.  Here is how we plan to help!  Based on your presentation I believe you most likely have A cough due to a virus.  This is called viral bronchitis and is best treated by rest, plenty of fluids and control of the cough.  You may use Ibuprofen or Tylenol as directed to help your symptoms.     In addition you may use A non-prescription cough medication called Mucinex DM: take 2 tablets every 12 hours. and A prescription cough medication called Tessalon Perles 100mg . You may take 1-2 capsules every 8 hours as needed for your cough.  I have also prescribed Albuterol inhaler Use 1-2 puffs every 6 hours as needed for shortness of breath, chest tightness, and/or wheezing.  From your responses in the eVisit questionnaire you describe inflammation in the upper respiratory tract which is causing a significant cough.  This is commonly called Bronchitis and has four common causes:   Allergies Viral Infections Acid Reflux Bacterial Infection Allergies, viruses and acid reflux are treated by controlling symptoms or eliminating the cause. An example might be a cough caused by taking certain blood pressure medications. You stop the cough by changing the medication. Another example might be a cough caused by acid reflux. Controlling the reflux helps control the cough.  USE OF BRONCHODILATOR ("RESCUE") INHALERS: There is a risk from using your bronchodilator too frequently.  The risk is that over-reliance on a medication which only relaxes the muscles surrounding the breathing tubes can reduce the effectiveness of medications prescribed to reduce swelling and congestion of the tubes themselves.  Although you feel brief relief from the bronchodilator inhaler, your asthma may actually be worsening with the tubes becoming more swollen and filled with mucus.  This can delay other crucial treatments, such as oral steroid medications. If you need to use a  bronchodilator inhaler daily, several times per day, you should discuss this with your provider.  There are probably better treatments that could be used to keep your asthma under control.     HOME CARE Only take medications as instructed by your medical team. Complete the entire course of an antibiotic. Drink plenty of fluids and get plenty of rest. Avoid close contacts especially the very young and the elderly Cover your mouth if you cough or cough into your sleeve. Always remember to wash your hands A steam or ultrasonic humidifier can help congestion.   GET HELP RIGHT AWAY IF: You develop worsening fever. You become short of breath You cough up blood. Your symptoms persist after you have completed your treatment plan MAKE SURE YOU  Understand these instructions. Will watch your condition. Will get help right away if you are not doing well or get worse.    Thank you for choosing an e-visit.  Your e-visit answers were reviewed by a board certified advanced clinical practitioner to complete your personal care plan. Depending upon the condition, your plan could have included both over the counter or prescription medications.  Please review your pharmacy choice. Make sure the pharmacy is open so you can pick up prescription now. If there is a problem, you may contact your provider through [employer] and have the prescription routed to another pharmacy.  Your safety is important to [place]. If you have drug allergies check your prescription carefully.   For the next 24 hours you can use MyChart to ask questions about today's visit, request a non-urgent call  back, or ask for a work or school excuse. You will get an email in the next two days asking about your experience. I hope that your e-visit has been valuable and will speed your recovery.   I have spent 5 minutes in review of e-visit questionnaire, review and updating patient chart, medical decision making and response to patient.    Margaretann Loveless, PA-C

## 2023-03-06 DIAGNOSIS — E785 Hyperlipidemia, unspecified: Secondary | ICD-10-CM | POA: Diagnosis not present

## 2023-03-06 DIAGNOSIS — I2510 Atherosclerotic heart disease of native coronary artery without angina pectoris: Secondary | ICD-10-CM | POA: Diagnosis not present

## 2023-03-06 DIAGNOSIS — R918 Other nonspecific abnormal finding of lung field: Secondary | ICD-10-CM | POA: Diagnosis not present

## 2023-03-06 DIAGNOSIS — I10 Essential (primary) hypertension: Secondary | ICD-10-CM | POA: Diagnosis not present

## 2023-03-06 DIAGNOSIS — R079 Chest pain, unspecified: Secondary | ICD-10-CM | POA: Diagnosis not present

## 2023-03-06 DIAGNOSIS — K449 Diaphragmatic hernia without obstruction or gangrene: Secondary | ICD-10-CM | POA: Diagnosis not present

## 2023-03-06 DIAGNOSIS — Z87891 Personal history of nicotine dependence: Secondary | ICD-10-CM | POA: Diagnosis not present

## 2023-03-06 DIAGNOSIS — N200 Calculus of kidney: Secondary | ICD-10-CM | POA: Diagnosis not present

## 2023-03-06 DIAGNOSIS — R29898 Other symptoms and signs involving the musculoskeletal system: Secondary | ICD-10-CM | POA: Diagnosis not present

## 2023-03-06 DIAGNOSIS — R200 Anesthesia of skin: Secondary | ICD-10-CM | POA: Diagnosis not present

## 2023-03-07 DIAGNOSIS — R200 Anesthesia of skin: Secondary | ICD-10-CM | POA: Diagnosis not present

## 2023-03-11 DIAGNOSIS — I2089 Other forms of angina pectoris: Secondary | ICD-10-CM | POA: Diagnosis not present

## 2023-03-11 DIAGNOSIS — E7800 Pure hypercholesterolemia, unspecified: Secondary | ICD-10-CM | POA: Diagnosis not present

## 2023-03-11 DIAGNOSIS — R0602 Shortness of breath: Secondary | ICD-10-CM | POA: Diagnosis not present

## 2023-03-11 DIAGNOSIS — I10 Essential (primary) hypertension: Secondary | ICD-10-CM | POA: Diagnosis not present

## 2023-03-11 DIAGNOSIS — I700 Atherosclerosis of aorta: Secondary | ICD-10-CM | POA: Diagnosis not present

## 2023-03-11 DIAGNOSIS — I2510 Atherosclerotic heart disease of native coronary artery without angina pectoris: Secondary | ICD-10-CM | POA: Diagnosis not present

## 2023-03-11 DIAGNOSIS — I6523 Occlusion and stenosis of bilateral carotid arteries: Secondary | ICD-10-CM | POA: Diagnosis not present

## 2023-03-30 DIAGNOSIS — I2089 Other forms of angina pectoris: Secondary | ICD-10-CM | POA: Diagnosis not present

## 2023-03-30 DIAGNOSIS — R0602 Shortness of breath: Secondary | ICD-10-CM | POA: Diagnosis not present

## 2023-04-07 DIAGNOSIS — R0602 Shortness of breath: Secondary | ICD-10-CM | POA: Diagnosis not present

## 2023-04-07 DIAGNOSIS — I700 Atherosclerosis of aorta: Secondary | ICD-10-CM | POA: Diagnosis not present

## 2023-04-07 DIAGNOSIS — I2510 Atherosclerotic heart disease of native coronary artery without angina pectoris: Secondary | ICD-10-CM | POA: Diagnosis not present

## 2023-04-07 DIAGNOSIS — E7800 Pure hypercholesterolemia, unspecified: Secondary | ICD-10-CM | POA: Diagnosis not present

## 2023-04-07 DIAGNOSIS — I10 Essential (primary) hypertension: Secondary | ICD-10-CM | POA: Diagnosis not present

## 2023-04-08 DIAGNOSIS — H43813 Vitreous degeneration, bilateral: Secondary | ICD-10-CM | POA: Diagnosis not present

## 2023-04-08 DIAGNOSIS — Z961 Presence of intraocular lens: Secondary | ICD-10-CM | POA: Diagnosis not present

## 2023-04-08 DIAGNOSIS — H26491 Other secondary cataract, right eye: Secondary | ICD-10-CM | POA: Diagnosis not present

## 2023-04-15 ENCOUNTER — Inpatient Hospital Stay: Payer: PPO | Attending: Oncology | Admitting: Oncology

## 2023-04-15 DIAGNOSIS — M810 Age-related osteoporosis without current pathological fracture: Secondary | ICD-10-CM | POA: Insufficient documentation

## 2023-04-15 DIAGNOSIS — C50412 Malignant neoplasm of upper-outer quadrant of left female breast: Secondary | ICD-10-CM | POA: Diagnosis not present

## 2023-04-15 NOTE — Progress Notes (Signed)
  [facility]  [phone]) [phone] Fax:(336) [phone]  ID: LEKIA NIER OB: 1950-05-20  MR#: [id number]  ZDG#:[id number]  Patient Care Team: Marguarite Arbour, MD as PCP - General (Internal Medicine) Jeralyn Ruths, MD as Consulting Physician (Oncology)  I connected with [name] on 04/17/23 at  3:30 PM EDT by video enabled telemedicine visit and verified that I am speaking with the correct person using two identifiers.   I discussed the limitations, risks, security and privacy concerns of performing an evaluation and management service by telemedicine and the availability of in-person appointments. I also discussed with the patient that there may be a patient responsible charge related to this service. The patient expressed understanding and agreed to proceed.   Other persons participating in the visit and their role in the encounter: Patient, MD.  Patient's location: Home. Provider's location: Clinic.   CHIEF COMPLAINT: Clinical stage Ia ER/PR positive, HER2 negative invasive carcinoma of the upper outer quadrant left breast.  Oncotype DX score 9, low risk.  INTERVAL HISTORY: Patient agreed to video-assisted telemedicine visit for routine 14-month evaluation.  She continues to tolerate tamoxifen and Fosamax without significant side effects.  She currently feels well and is asymptomatic.  She has no neurologic complaints.  She denies any recent fevers or illnesses.  She has a good appetite and denies weight loss.  She has no chest pain, shortness of breath, cough, or hemoptysis.  She denies any nausea, vomiting, constipation, or diarrhea.  She has no urinary complaints.  Patient offers no specific complaints today.  REVIEW OF SYSTEMS:   Review of Systems  Constitutional: Negative.  Negative for fever, malaise/fatigue and weight loss.  Respiratory: Negative.  Negative for cough, hemoptysis and shortness of breath.   Cardiovascular: Negative.   Negative for chest pain and leg swelling.  Gastrointestinal: Negative.  Negative for abdominal pain.  Genitourinary: Negative.  Negative for dysuria.  Musculoskeletal: Negative.  Negative for back pain.  Skin: Negative.  Negative for rash.  Neurological: Negative.  Negative for dizziness, focal weakness, weakness and headaches.  Psychiatric/Behavioral: Negative.  The patient is not nervous/anxious.     As per HPI. Otherwise, a complete review of systems is negative.  PAST MEDICAL HISTORY: Past Medical History:  Diagnosis Date   Breast cancer (HCC) 2022   Left Breast   Family history of breast cancer    Family history of lung cancer    GERD (gastroesophageal reflux disease)    High cholesterol    History of kidney stones    Hypertension    Motion sickness    boats, back seats of cars   Osteoporosis    RLS (restless legs syndrome)    Vitamin D deficiency     PAST SURGICAL HISTORY: Past Surgical History:  Procedure Laterality Date   ABDOMINAL HYSTERECTOMY     Partial    BREAST CYST ASPIRATION Left    neg   CATARACT EXTRACTION W/PHACO Left 08/02/2019   Procedure: CATARACT EXTRACTION PHACO AND INTRAOCULAR LENS PLACEMENT (IOC) LEFT;  Surgeon: Lockie Mola, MD;  Location: [facility];  Service: Ophthalmology;  Laterality: Left;  5.46 0:56.5 9.7%   CATARACT EXTRACTION W/PHACO Right 08/30/2019   Procedure: CATARACT EXTRACTION PHACO AND INTRAOCULAR LENS PLACEMENT (IOC) RIGHT;  Surgeon: Lockie Mola, MD;  Location: [facility];  Service: Ophthalmology;  Laterality: Right;  5.53 0:51.0 10.8%   COLONOSCOPY     06/15/00; 09/27/07   COLONOSCOPY WITH PROPOFOL N/A 12/06/2017   Procedure: COLONOSCOPY WITH PROPOFOL;  Surgeon: Scot Jun, MD;  Location: [facility];  Service: Endoscopy;  Laterality: N/A;   FOOT SURGERY  2000   MASTECTOMY Left 2022   SIMPLE MASTECTOMY WITH AXILLARY SENTINEL NODE BIOPSY Left 08/21/2020   Procedure: SIMPLE  MASTECTOMY WITH AXILLARY SENTINEL NODE BIOPSY;  Surgeon: Earline Mayotte, MD;  Location: [facility] ORS;  Service: General;  Laterality: Left;    FAMILY HISTORY: Family History  Problem Relation Age of Onset   Hypertension Mother    Coronary artery disease Mother    Heart attack Mother    Hypertension Father    Skin cancer Father    Breast cancer Maternal Aunt        dx late 87s   Lung cancer Maternal Uncle    Breast cancer Paternal Aunt        dx 40s-50s   Osteoporosis Maternal Grandmother    Breast cancer Paternal Grandmother        dx 20s-30s   Hypertension Son    Breast cancer Cousin 40       paternal side    ADVANCED DIRECTIVES (Y/N):  N  HEALTH MAINTENANCE: Social History   Tobacco Use   Smoking status: Every Day    Current packs/day: 1.00    Average packs/day: 1 pack/day for 40.0 years (40.0 ttl pk-yrs)    Types: Cigarettes   Smokeless tobacco: Never  Vaping Use   Vaping status: Never Used  Substance Use Topics   Alcohol use: No   Drug use: No     Colonoscopy:  PAP:  Bone density:  Lipid panel:  No Known Allergies  Current Outpatient Medications  Medication Sig Dispense Refill   albuterol (VENTOLIN HFA) 108 (90 Base) MCG/ACT inhaler Inhale 1-2 puffs into the lungs every 6 (six) hours as needed. 8 g 0   alendronate (FOSAMAX) 70 MG tablet Take 1 tablet (70 mg total) by mouth every Monday. Take with a full glass of water on an empty stomach. 12 tablet 3   aspirin 81 MG chewable tablet Chew 81 mg by mouth at bedtime.     atorvastatin (LIPITOR) 20 MG tablet Take 20 mg by mouth at bedtime.     benzonatate (TESSALON) 100 MG capsule Take 1-2 capsules (100-200 mg total) by mouth 3 (three) times daily as needed. 30 capsule 0   bisoprolol-hydrochlorothiazide (ZIAC) 10-6.25 MG tablet Take 1 tablet by mouth daily.     MELATONIN PO Take 5 mg by mouth at bedtime.     oseltamivir (TAMIFLU) 75 MG capsule Take 1 capsule (75 mg total) by mouth every 12 (twelve) hours. 10  capsule 0   tamoxifen (NOLVADEX) 20 MG tablet Take 1 tablet (20 mg total) by mouth daily. 90 tablet 3   traZODone (DESYREL) 100 MG tablet Take 100 mg by mouth at bedtime.     zinc gluconate 50 MG tablet Take 50 mg by mouth at bedtime.     No current facility-administered medications for this visit.    OBJECTIVE: There were no vitals filed for this visit.    There is no height or weight on file to calculate BMI.    ECOG FS:0 - Asymptomatic  General: Well-developed, well-nourished, no acute distress. HEENT: Normocephalic. Neuro: Alert, answering all questions appropriately. Cranial nerves grossly intact. Psych: Normal affect.  LAB RESULTS:  No results found for: "NA", "K", "CL", "CO2", "GLUCOSE", "BUN", "CREATININE", "CALCIUM", "PROT", "ALBUMIN", "AST", "ALT", "ALKPHOS", "BILITOT", "GFRNONAA", "GFRAA"  No results found for: "WBC", "NEUTROABS", "HGB", "HCT", "MCV", "PLT"  STUDIES: No results found.  ASSESSMENT: Clinical stage Ia ER/PR positive, HER2 negative invasive carcinoma of the upper outer quadrant left breast.  PLAN:    Clinical stage Ia ER/PR positive, HER2 negative invasive carcinoma of the upper outer quadrant left breast: Patient underwent total mastectomy on August 21, 2020.  Because of this, she did not require adjuvant XRT.  Oncotype Dx = 9 is low risk therefore chemotherapy was not necessary.  Patient was initially started on letrozole, but then switched to tamoxifen given her underlying osteoporosis.  Continue treatment for total of 5 years completing in August 2027.  Her most recent right breast screening mammogram on July 30, 2022 was reported as BI-RADS 1.  Repeat in July 2025.  Return to clinic in 6 months for continued evaluation. Osteoporosis: Patient's most recent bone mineral density on October 12, 2022 reported T-score of -4.0 which is essentially unchanged for the past several years where her T-score was reported as  -4.2 and -4.0 respectively.  We once again  discussed discontinuing Fosamax and proceeding with Prolia to which patient has now agreed.  Continue calcium and vitamin D supplementation.  Return to clinic in 1 week for laboratory work and Prolia.  Patient will then return to clinic in 6 months for repeat laboratory, further evaluation, and continuation of treatment.  Repeat bone mineral density prior to next clinic visit.  I provided 30 minutes of face-to-face video visit time during this encounter which included chart review, counseling, and coordination of care as documented above.    Patient expressed understanding and was in agreement with this plan. She also understands that She can call clinic at any time with any questions, concerns, or complaints.    Cancer Staging  Carcinoma of upper-outer quadrant of female breast, left [facility]) Staging form: Breast, AJCC 8th Edition - Clinical stage from 08/16/2020: Stage IA (cT1b, cN0, cM0, G1, ER+, PR+, HER2-) - Signed by Jeralyn Ruths, MD on 08/16/2020 Stage prefix: Initial diagnosis Histologic grading system: 3 grade system   Jeralyn Ruths, MD   04/17/2023 10:08 AM

## 2023-04-17 ENCOUNTER — Encounter: Payer: Self-pay | Admitting: Oncology

## 2023-04-20 DIAGNOSIS — C50412 Malignant neoplasm of upper-outer quadrant of left female breast: Secondary | ICD-10-CM | POA: Diagnosis not present

## 2023-04-20 DIAGNOSIS — F5104 Psychophysiologic insomnia: Secondary | ICD-10-CM | POA: Diagnosis not present

## 2023-04-20 DIAGNOSIS — Z0000 Encounter for general adult medical examination without abnormal findings: Secondary | ICD-10-CM | POA: Diagnosis not present

## 2023-04-20 DIAGNOSIS — Z79899 Other long term (current) drug therapy: Secondary | ICD-10-CM | POA: Diagnosis not present

## 2023-04-20 DIAGNOSIS — E559 Vitamin D deficiency, unspecified: Secondary | ICD-10-CM | POA: Diagnosis not present

## 2023-04-20 DIAGNOSIS — E7800 Pure hypercholesterolemia, unspecified: Secondary | ICD-10-CM | POA: Diagnosis not present

## 2023-04-20 DIAGNOSIS — M810 Age-related osteoporosis without current pathological fracture: Secondary | ICD-10-CM | POA: Diagnosis not present

## 2023-04-20 DIAGNOSIS — I10 Essential (primary) hypertension: Secondary | ICD-10-CM | POA: Diagnosis not present

## 2023-04-20 DIAGNOSIS — J431 Panlobular emphysema: Secondary | ICD-10-CM | POA: Diagnosis not present

## 2023-04-22 ENCOUNTER — Inpatient Hospital Stay

## 2023-04-22 VITALS — BP 144/76 | HR 55

## 2023-04-22 DIAGNOSIS — C50412 Malignant neoplasm of upper-outer quadrant of left female breast: Secondary | ICD-10-CM | POA: Diagnosis not present

## 2023-04-22 DIAGNOSIS — M810 Age-related osteoporosis without current pathological fracture: Secondary | ICD-10-CM

## 2023-04-22 MED ORDER — DENOSUMAB 60 MG/ML ~~LOC~~ SOSY
60.0000 mg | PREFILLED_SYRINGE | Freq: Once | SUBCUTANEOUS | Status: AC
  Administered 2023-04-22: 60 mg via SUBCUTANEOUS
  Filled 2023-04-22: qty 1

## 2023-06-02 ENCOUNTER — Telehealth: Admitting: Nurse Practitioner

## 2023-06-02 DIAGNOSIS — J019 Acute sinusitis, unspecified: Secondary | ICD-10-CM | POA: Diagnosis not present

## 2023-06-02 DIAGNOSIS — B9789 Other viral agents as the cause of diseases classified elsewhere: Secondary | ICD-10-CM

## 2023-06-03 MED ORDER — FLUTICASONE PROPIONATE 50 MCG/ACT NA SUSP: 2.0000 | Freq: Every day | NASAL | 0 refills | Status: DC

## 2023-06-03 NOTE — Progress Notes (Signed)
 E-Visit for Sinus Problems  We are sorry that you are not feeling well.  Here is how we plan to help!  Based on what you have shared with me it looks like you have sinusitis.  Sinusitis is inflammation and infection in the sinus cavities of the head.  Based on your presentation I believe you most likely have Acute Viral Sinusitis.This is an infection most likely caused by a virus. There is not specific treatment for viral sinusitis other than to help you with the symptoms until the infection runs its course.  You may use an oral decongestant such as Mucinex D or if you have glaucoma or high blood pressure use plain Mucinex. Saline nasal spray help and can safely be used as often as needed for congestion, I have prescribed: Fluticasone nasal spray two sprays in each nostril once a day  Some authorities believe that zinc sprays or the use of Echinacea may shorten the course of your symptoms.  Sinus infections are not as easily transmitted as other respiratory infection, however we still recommend that you avoid close contact with loved ones, especially the very young and elderly.  Remember to wash your hands thoroughly throughout the day as this is the number one way to prevent the spread of infection!  Home Care: Only take medications as instructed by your medical team. Do not take these medications with alcohol. A steam or ultrasonic humidifier can help congestion.  You can place a towel over your head and breathe in the steam from hot water coming from a faucet. Avoid close contacts especially the very young and the elderly. Cover your mouth when you cough or sneeze. Always remember to wash your hands.  Get Help Right Away If: You develop worsening fever or sinus pain. You develop a severe head ache or visual changes. Your symptoms persist after you have completed your treatment plan.  Make sure you Understand these instructions. Will watch your condition. Will get help right away if you  are not doing well or get worse.   Thank you for choosing an e-visit.  Your e-visit answers were reviewed by a board certified advanced clinical practitioner to complete your personal care plan. Depending upon the condition, your plan could have included both over the counter or prescription medications.  Please review your pharmacy choice. Make sure the pharmacy is open so you can pick up prescription now. If there is a problem, you may contact your provider through [employer] and have the prescription routed to another pharmacy.  Your safety is important to [place]. If you have drug allergies check your prescription carefully.   For the next 24 hours you can use MyChart to ask questions about today's visit, request a non-urgent call back, or ask for a work or school excuse. You will get an email in the next two days asking about your experience. I hope that your e-visit has been valuable and will speed your recovery.

## 2023-06-03 NOTE — Progress Notes (Signed)
 I have spent 5 minutes in review of e-visit questionnaire, review and updating patient chart, medical decision making and response to patient.   Piedad Climes, PA-C

## 2023-06-04 ENCOUNTER — Other Ambulatory Visit: Payer: Self-pay | Admitting: Surgery

## 2023-06-04 DIAGNOSIS — Z853 Personal history of malignant neoplasm of breast: Secondary | ICD-10-CM

## 2023-06-10 ENCOUNTER — Ambulatory Visit
Admission: RE | Admit: 2023-06-10 | Discharge: 2023-06-10 | Disposition: A | Discharge status: Home / Self Care | Source: Ambulatory Visit | Attending: Surgery | Admitting: Surgery

## 2023-06-10 DIAGNOSIS — Z853 Personal history of malignant neoplasm of breast: Secondary | ICD-10-CM | POA: Insufficient documentation

## 2023-06-10 DIAGNOSIS — Z9012 Acquired absence of left breast and nipple: Secondary | ICD-10-CM | POA: Diagnosis not present

## 2023-06-10 DIAGNOSIS — R92323 Mammographic fibroglandular density, bilateral breasts: Secondary | ICD-10-CM | POA: Diagnosis not present

## 2023-06-10 DIAGNOSIS — Z1239 Encounter for other screening for malignant neoplasm of breast: Secondary | ICD-10-CM | POA: Diagnosis not present

## 2023-06-10 MED ORDER — GADOBUTROL 1 MMOL/ML IV SOLN
5.0000 mL | Freq: Once | INTRAVENOUS | Status: AC | PRN
  Administered 2023-06-10: 5 mL via INTRAVENOUS

## 2023-07-06 DIAGNOSIS — Z872 Personal history of diseases of the skin and subcutaneous tissue: Secondary | ICD-10-CM | POA: Diagnosis not present

## 2023-07-06 DIAGNOSIS — Z859 Personal history of malignant neoplasm, unspecified: Secondary | ICD-10-CM | POA: Diagnosis not present

## 2023-07-06 DIAGNOSIS — L578 Other skin changes due to chronic exposure to nonionizing radiation: Secondary | ICD-10-CM | POA: Diagnosis not present

## 2023-07-10 ENCOUNTER — Encounter: Payer: Self-pay | Admitting: Oncology

## 2023-07-28 ENCOUNTER — Ambulatory Visit

## 2023-10-13 ENCOUNTER — Ambulatory Visit
Admission: RE | Admit: 2023-10-13 | Discharge: 2023-10-13 | Disposition: A | Discharge status: Home / Self Care | Source: Ambulatory Visit | Attending: Oncology | Admitting: Oncology

## 2023-10-13 DIAGNOSIS — M810 Age-related osteoporosis without current pathological fracture: Secondary | ICD-10-CM | POA: Diagnosis not present

## 2023-10-13 DIAGNOSIS — Z780 Asymptomatic menopausal state: Secondary | ICD-10-CM | POA: Diagnosis not present

## 2023-10-15 ENCOUNTER — Encounter: Payer: Self-pay | Admitting: Oncology

## 2023-10-21 ENCOUNTER — Other Ambulatory Visit: Payer: Self-pay

## 2023-10-21 DIAGNOSIS — M810 Age-related osteoporosis without current pathological fracture: Secondary | ICD-10-CM

## 2023-10-21 DIAGNOSIS — C50412 Malignant neoplasm of upper-outer quadrant of left female breast: Secondary | ICD-10-CM

## 2023-10-22 ENCOUNTER — Inpatient Hospital Stay: Attending: Oncology

## 2023-10-22 ENCOUNTER — Inpatient Hospital Stay

## 2023-10-22 ENCOUNTER — Encounter: Payer: Self-pay | Admitting: Oncology

## 2023-10-22 ENCOUNTER — Inpatient Hospital Stay: Admitting: Oncology

## 2023-10-22 VITALS — BP 129/80 | HR 60 | Temp 97.0°F | Resp 18 | Wt 135.0 lb

## 2023-10-22 DIAGNOSIS — M810 Age-related osteoporosis without current pathological fracture: Secondary | ICD-10-CM | POA: Insufficient documentation

## 2023-10-22 DIAGNOSIS — C50412 Malignant neoplasm of upper-outer quadrant of left female breast: Secondary | ICD-10-CM

## 2023-10-22 DIAGNOSIS — Z79810 Long term (current) use of selective estrogen receptor modulators (SERMs): Secondary | ICD-10-CM | POA: Insufficient documentation

## 2023-10-22 DIAGNOSIS — Z170 Estrogen receptor positive status [ER+]: Secondary | ICD-10-CM | POA: Diagnosis not present

## 2023-10-22 LAB — BASIC METABOLIC PANEL - CANCER CENTER ONLY
Anion gap: 8 (ref 5–15)
BUN: 17 mg/dL (ref 8–23)
CO2: 26 mmol/L (ref 22–32)
Calcium: 9 mg/dL (ref 8.9–10.3)
Chloride: 99 mmol/L (ref 98–111)
Creatinine: 0.72 mg/dL (ref 0.44–1.00)
GFR, Estimated: >60 mL/min (ref >60)
Glucose, Bld: 107 mg/dL — ABNORMAL HIGH (ref 70–99)
Potassium: 3.9 mmol/L (ref 3.5–5.1)
Sodium: 133 mmol/L — ABNORMAL LOW (ref 135–145)

## 2023-10-22 MED ORDER — DENOSUMAB 60 MG/ML ~~LOC~~ SOSY
60.0000 mg | PREFILLED_SYRINGE | Freq: Once | SUBCUTANEOUS | Status: AC
  Administered 2023-10-22: 60 mg via SUBCUTANEOUS
  Filled 2023-10-22: qty 1

## 2023-10-22 NOTE — Progress Notes (Signed)
 Patient is having some thinning of her hair so she started taking prenatal vitamin.  Does she need the refill on the tamoxifen .

## 2023-10-22 NOTE — Progress Notes (Signed)
 [facility]  [phone] [phone]  ID: Kim Cook OB: Dec 22, 1950  MR#: [id number]  RDW#:[id number]  Patient Care Team: Auston Reyes BIRCH, MD as PCP - General (Internal Medicine) Jacobo Evalene PARAS, MD as Consulting Physician (Oncology)   CHIEF COMPLAINT: Clinical stage Ia ER/PR positive, HER2 negative invasive carcinoma of the upper outer quadrant left breast.  Oncotype DX score 9, low risk.  INTERVAL HISTORY: Patient returns to clinic today for further evaluation and continuation of Prolia .  She continues to feel well and remains asymptomatic.  She is tolerating tamoxifen  without significant side effects.  She continues to remain active and is flying to [place]  to visit family tomorrow.  She has no neurologic complaints.  She denies any recent fevers or illnesses.  She has a good appetite and denies weight loss.  She has no chest pain, shortness of breath, cough, or hemoptysis.  She denies any nausea, vomiting, constipation, or diarrhea.  She has no urinary complaints.  Patient offers no specific complaints today.  REVIEW OF SYSTEMS:   Review of Systems  Constitutional: Negative.  Negative for fever, malaise/fatigue and weight loss.  Respiratory: Negative.  Negative for cough, hemoptysis and shortness of breath.   Cardiovascular: Negative.  Negative for chest pain and leg swelling.  Gastrointestinal: Negative.  Negative for abdominal pain.  Genitourinary: Negative.  Negative for dysuria.  Musculoskeletal: Negative.  Negative for back pain.  Skin: Negative.  Negative for rash.  Neurological: Negative.  Negative for dizziness, focal weakness, weakness and headaches.  Psychiatric/Behavioral: Negative.  The patient is not nervous/anxious.     As per HPI. Otherwise, a complete review of systems is negative.  PAST MEDICAL HISTORY: Past Medical History:  Diagnosis Date   Breast cancer (HCC) 2022   Left Breast   Family history of breast  cancer    Family history of lung cancer    GERD (gastroesophageal reflux disease)    High cholesterol    History of kidney stones    Hypertension    Motion sickness    boats, back seats of cars   Osteoporosis    RLS (restless legs syndrome)    Vitamin D deficiency     PAST SURGICAL HISTORY: Past Surgical History:  Procedure Laterality Date   ABDOMINAL HYSTERECTOMY     Partial    BREAST CYST ASPIRATION Left    neg   CATARACT EXTRACTION W/PHACO Left 08/02/2019   Procedure: CATARACT EXTRACTION PHACO AND INTRAOCULAR LENS PLACEMENT (IOC) LEFT;  Surgeon: Mittie Gaskin, MD;  Location[facility];  Service: Ophthalmology;  Laterality: Left;  5.46 0:56.5 9.7%   CATARACT EXTRACTION W/PHACO Right 08/30/2019   Procedure: CATARACT EXTRACTION PHACO AND INTRAOCULAR LENS PLACEMENT (IOC) RIGHT;  Surgeon: Mittie Gaskin, MD;  [facility];  Service: Ophthalmology;  Laterality: Right;  5.53 0:51.0 10.8%   COLONOSCOPY     06/15/00; 09/27/07   COLONOSCOPY WITH PROPOFOL  N/A 12/06/2017   Procedure: COLONOSCOPY WITH PROPOFOL ;  Surgeon: Viktoria Lamar DASEN, MD;  Location: [facility];  Service: Endoscopy;  Laterality: N/A;   FOOT SURGERY  2000   MASTECTOMY Left 2022   SIMPLE MASTECTOMY WITH AXILLARY SENTINEL NODE BIOPSY Left 08/21/2020   Procedure: SIMPLE MASTECTOMY WITH AXILLARY SENTINEL NODE BIOPSY;  Surgeon: Dessa Reyes ORN, MD;  Location: [facility] ORS;  Service: General;  Laterality: Left;    FAMILY HISTORY: Family History  Problem Relation Age of Onset   Hypertension Mother    Coronary artery disease Mother    Heart  attack Mother    Hypertension Father    Skin cancer Father    Breast cancer Maternal Aunt        dx late 42s   Lung cancer Maternal Uncle    Breast cancer Paternal Aunt        dx 31s-50s   Osteoporosis Maternal Grandmother    Breast cancer Paternal Grandmother        dx 20s-30s   Hypertension Son    Breast cancer Cousin 40        paternal side    ADVANCED DIRECTIVES (Y/N):  N  HEALTH MAINTENANCE: Social History   Tobacco Use   Smoking status: Every Day    Current packs/day: 1.00    Average packs/day: 1 pack/day for 40.0 years (40.0 ttl pk-yrs)    Types: Cigarettes   Smokeless tobacco: Never  Vaping Use   Vaping status: Never Used  Substance Use Topics   Alcohol use: No   Drug use: No     Colonoscopy:  PAP:  Bone density:  Lipid panel:  No Known Allergies  Current Outpatient Medications  Medication Sig Dispense Refill   aspirin 81 MG chewable tablet Chew 81 mg by mouth at bedtime.     atorvastatin (LIPITOR) 20 MG tablet Take 20 mg by mouth at bedtime.     bisoprolol-hydrochlorothiazide (ZIAC) 10-6.25 MG tablet Take 1 tablet by mouth daily.     tamoxifen  (NOLVADEX ) 20 MG tablet Take 1 tablet (20 mg total) by mouth daily. 90 tablet 3   traZODone (DESYREL) 100 MG tablet Take 100 mg by mouth at bedtime.     No current facility-administered medications for this visit.   Facility-Administered Medications Ordered in Other Visits  Medication Dose Route Frequency Provider Last Rate Last Admin   denosumab  (PROLIA ) injection 60 mg  60 mg Subcutaneous Once Thadeus Gandolfi J, MD        OBJECTIVE: Vitals:   10/22/23 1039  BP: 129/80  Pulse: 60  Resp: 18  Temp: (!) 97 F (36.1 C)  SpO2: 100%      Body mass index is 22.81 kg/m.    ECOG FS:0 - Asymptomatic  General: Well-developed, well-nourished, no acute distress. Eyes: Pink conjunctiva, anicteric sclera. HEENT: Normocephalic, moist mucous membranes. Lungs: No audible wheezing or coughing. Heart: Regular rate and rhythm. Abdomen: Soft, nontender, no obvious distention. Musculoskeletal: No edema, cyanosis, or clubbing. Neuro: Alert, answering all questions appropriately. Cranial nerves grossly intact. Skin: No rashes or petechiae noted. Psych: Normal affect.  LAB RESULTS:  Lab Results  Component Value Date   NA 133 (L) 10/22/2023   K  3.9 10/22/2023   CL 99 10/22/2023   CO2 26 10/22/2023   GLUCOSE 107 (H) 10/22/2023   BUN 17 10/22/2023   CREATININE 0.72 10/22/2023   CALCIUM 9.0 10/22/2023   GFRNONAA >60 10/22/2023    No results found for: WBC, NEUTROABS, HGB, HCT, MCV, PLT   STUDIES: DG Bone Density Result Date: 10/13/2023 EXAM: DUAL X-RAY ABSORPTIOMETRY (DXA) FOR BONE MINERAL DENSITY 10/13/2023 11:31 am CLINICAL DATA:  73 year old Female Postmenopausal. Postmenopausal History of fragility fracture. Patient is or has been on bone building therapies. TECHNIQUE: An axial (e.g., hips, spine) and/or appendicular (e.g., radius) exam was performed, as appropriate, using GE Secretary/administrator at [employer] [facility]. Images are obtained for bone mineral density measurement and are not obtained for diagnostic purposes. [id number] Exclusions: Lumbar spine. COMPARISON:  None. New baseline. FINDINGS: Scan quality: Good. LEFT FEMORAL NECK:  BMD (in g/cm2): 0.757 T-score: -2.0 Z-score: -0.1 LEFT TOTAL HIP: BMD (in g/cm2): 0.780 T-score: -1.8 Z-score: -0.1 RIGHT FEMORAL NECK: BMD (in g/cm2): 0.773 T-score: -1.9 Z-score: 0.0 RIGHT TOTAL HIP: BMD (in g/cm2): 0.818 T-score: -1.5 Z-score: 0.2 LEFT FOREARM (RADIUS 33%): BMD (in g/cm2): 0.517 T-score: -4.1 Z-score: -1.9 FRAX 10-YEAR PROBABILITY OF FRACTURE: FRAX not reported as the lowest BMD is not in the osteopenia range. IMPRESSION: Osteoporosis based on BMD. Fracture risk is unknown due to history of bone building therapy. RECOMMENDATIONS: 1. All patients should optimize calcium and vitamin D intake. 2. Consider FDA-approved medical therapies in postmenopausal women and men aged 29 years and older, based on the following: - A hip or vertebral (clinical or morphometric) fracture - T-score less than or equal to -2.5 and secondary causes have been excluded. - Low bone mass (T-score between -1.0 and -2.5) and a 10-year probability of a hip fracture greater than or equal  to 3% or a 10-year probability of a major osteoporosis-related fracture greater than or equal to 20% based on the [place] -adapted WHO algorithm. - Clinician judgment and/or patient preferences may indicate treatment for people with 10-year fracture probabilities above or below these levels 3. Patients with diagnosis of osteoporosis or at high risk for fracture should have regular bone mineral density tests. For patients eligible for Medicare, routine testing is allowed once every 2 years. The testing frequency can be increased to one year for patients who have rapidly progressing disease, those who are receiving or discontinuing medical therapy to restore bone mass, or have additional risk factors. Electronically Signed   By: Dina  Arceo M.D.   On: 10/13/2023 14:24    ASSESSMENT: Clinical stage Ia ER/PR positive, HER2 negative invasive carcinoma of the upper outer quadrant left breast.  PLAN:    Clinical stage Ia ER/PR positive, HER2 negative invasive carcinoma of the upper outer quadrant left breast: Patient underwent total mastectomy on August 21, 2020.  Because of this, she did not require adjuvant XRT.  Oncotype Dx = 9 is low risk therefore chemotherapy was not necessary.  Patient was initially started on letrozole , but then switched to tamoxifen  given her underlying osteoporosis.  Continue treatment for total of 5 years completing in August 2027.  Patient underwent breast MRI on Jun 10, 2023 that was reported as BI-RADS 1.  Repeat imaging in May 2026.  No further intervention is needed.  Return to clinic after her mammogram in May for repeat laboratory work, further evaluation, and continuation of Prolia . Osteoporosis: Patient's bone mineral density on October 13, 2023 revealed persistent osteoporosis.  Continue calcium and vitamin D.  Proceed with Prolia  today.  Return to clinic in after her mammogram with repeat laboratory, further evaluation, and continuation of treatment.  I spent a total of 30 minutes  reviewing chart data, face-to-face evaluation with the patient, counseling and coordination of care as detailed above.   Patient expressed understanding and was in agreement with this plan. She also understands that She can call clinic at any time with any questions, concerns, or complaints.    Cancer Staging  Carcinoma of upper-outer quadrant of female breast, left [facility]) Staging form: Breast, AJCC 8th Edition - Clinical stage from 08/16/2020: Stage IA (cT1b, cN0, cM0, G1, ER+, PR+, HER2-) - Signed by Jacobo Evalene PARAS, MD on 08/16/2020 Stage prefix: Initial diagnosis Histologic grading system: 3 grade system   Evalene PARAS Jacobo, MD   10/22/2023 10:47 AM

## 2023-11-24 ENCOUNTER — Other Ambulatory Visit: Payer: Self-pay | Admitting: Oncology

## 2023-12-01 ENCOUNTER — Ambulatory Visit
Admission: RE | Admit: 2023-12-01 | Discharge: 2023-12-01 | Disposition: A | Discharge status: Home / Self Care | Source: Ambulatory Visit | Attending: Oncology | Admitting: Oncology

## 2023-12-01 DIAGNOSIS — C50412 Malignant neoplasm of upper-outer quadrant of left female breast: Secondary | ICD-10-CM

## 2023-12-01 DIAGNOSIS — Z1231 Encounter for screening mammogram for malignant neoplasm of breast: Secondary | ICD-10-CM | POA: Diagnosis not present

## 2024-04-26 ENCOUNTER — Ambulatory Visit: Admitting: Oncology

## 2024-05-29 ENCOUNTER — Ambulatory Visit: Admitting: Oncology

## 2024-05-29 ENCOUNTER — Ambulatory Visit

## 2024-05-29 ENCOUNTER — Other Ambulatory Visit
